# Patient Record
Sex: Female | Born: 1970 | Race: White | Hispanic: No | Marital: Married | Smoking: Never smoker
Health system: Southern US, Community
[De-identification: ages and names within clinical notes are randomized; demographics above are authoritative.]

## PROBLEM LIST (undated history)

## (undated) DIAGNOSIS — Z9889 Other specified postprocedural states: Secondary | ICD-10-CM

## (undated) DIAGNOSIS — E039 Hypothyroidism, unspecified: Secondary | ICD-10-CM

## (undated) DIAGNOSIS — R112 Nausea with vomiting, unspecified: Secondary | ICD-10-CM

## (undated) DIAGNOSIS — Z87442 Personal history of urinary calculi: Secondary | ICD-10-CM

## (undated) DIAGNOSIS — Z8719 Personal history of other diseases of the digestive system: Secondary | ICD-10-CM

## (undated) DIAGNOSIS — K219 Gastro-esophageal reflux disease without esophagitis: Secondary | ICD-10-CM

## (undated) DIAGNOSIS — F419 Anxiety disorder, unspecified: Secondary | ICD-10-CM

## (undated) HISTORY — PX: TUBAL LIGATION: SHX77

## (undated) HISTORY — PX: NASAL SINUS SURGERY: SHX719

---

## 1999-02-14 ENCOUNTER — Other Ambulatory Visit: Admission: RE | Admit: 1999-02-14 | Discharge: 1999-02-14 | Payer: Self-pay | Admitting: Otolaryngology

## 2010-01-16 ENCOUNTER — Ambulatory Visit (HOSPITAL_COMMUNITY): Admission: RE | Admit: 2010-01-16 | Discharge: 2010-01-16 | Payer: Self-pay | Admitting: Family Medicine

## 2010-02-06 ENCOUNTER — Ambulatory Visit (HOSPITAL_COMMUNITY): Admission: RE | Admit: 2010-02-06 | Discharge: 2010-02-06 | Payer: Self-pay | Admitting: Family Medicine

## 2010-03-27 ENCOUNTER — Ambulatory Visit (HOSPITAL_COMMUNITY): Admission: RE | Admit: 2010-03-27 | Discharge: 2010-03-27 | Payer: Self-pay | Admitting: Family Medicine

## 2010-05-16 ENCOUNTER — Ambulatory Visit (HOSPITAL_COMMUNITY): Admission: RE | Admit: 2010-05-16 | Discharge: 2010-05-16 | Payer: Self-pay | Admitting: Family Medicine

## 2010-06-27 ENCOUNTER — Ambulatory Visit (HOSPITAL_COMMUNITY): Admission: RE | Admit: 2010-06-27 | Discharge: 2010-06-27 | Payer: Self-pay | Admitting: Family Medicine

## 2010-07-25 ENCOUNTER — Ambulatory Visit (HOSPITAL_COMMUNITY): Admission: RE | Admit: 2010-07-25 | Discharge: 2010-07-25 | Payer: Self-pay | Admitting: Family Medicine

## 2010-08-13 ENCOUNTER — Ambulatory Visit: Payer: Self-pay | Admitting: Obstetrics and Gynecology

## 2010-08-13 ENCOUNTER — Inpatient Hospital Stay (HOSPITAL_COMMUNITY): Admission: AD | Admit: 2010-08-13 | Discharge: 2010-08-13 | Payer: Self-pay | Admitting: Obstetrics and Gynecology

## 2010-08-18 ENCOUNTER — Inpatient Hospital Stay (HOSPITAL_COMMUNITY): Admission: RE | Admit: 2010-08-18 | Discharge: 2010-08-21 | Payer: Self-pay | Admitting: Obstetrics and Gynecology

## 2011-02-12 LAB — URINALYSIS, ROUTINE W REFLEX MICROSCOPIC
Bilirubin Urine: NEGATIVE
Glucose, UA: NEGATIVE mg/dL
Hgb urine dipstick: NEGATIVE
Nitrite: NEGATIVE
Specific Gravity, Urine: 1.015 (ref 1.005–1.030)

## 2011-02-12 LAB — URINE MICROSCOPIC-ADD ON

## 2011-02-12 LAB — COMPREHENSIVE METABOLIC PANEL
ALT: 8 U/L (ref 0–35)
AST: 17 U/L (ref 0–37)
Albumin: 2.6 g/dL — ABNORMAL LOW (ref 3.5–5.2)
CO2: 23 mEq/L (ref 19–32)
Calcium: 8.5 mg/dL (ref 8.4–10.5)
GFR calc non Af Amer: >60 mL/min (ref >60)
Glucose, Bld: 77 mg/dL (ref 70–99)

## 2011-02-12 LAB — CBC
Hemoglobin: 9.5 g/dL — ABNORMAL LOW (ref 12.0–15.0)
Hemoglobin: 10.8 g/dL — ABNORMAL LOW (ref 12.0–15.0)
Hemoglobin: 12.1 g/dL (ref 12.0–15.0)
MCH: 29.7 pg (ref 26.0–34.0)
MCHC: 33.7 g/dL (ref 30.0–36.0)
MCHC: 33.8 g/dL (ref 30.0–36.0)
MCV: 87.5 fL (ref 78.0–100.0)
MCV: 86.7 fL (ref 78.0–100.0)
Platelets: 204 10*3/uL (ref 150–400)
RBC: 3.22 MIL/uL — ABNORMAL LOW (ref 3.87–5.11)
RBC: 3.69 MIL/uL — ABNORMAL LOW (ref 3.87–5.11)
RBC: 4.07 MIL/uL (ref 3.87–5.11)
RDW: 14.5 % (ref 11.5–15.5)
WBC: 6.4 10*3/uL (ref 4.0–10.5)
WBC: 6.5 10*3/uL (ref 4.0–10.5)

## 2011-02-12 LAB — SURGICAL PCR SCREEN
MRSA, PCR: NEGATIVE
Staphylococcus aureus: NEGATIVE

## 2011-02-12 LAB — URIC ACID: Uric Acid, Serum: 4.4 mg/dL (ref 2.4–7.0)

## 2011-05-26 NOTE — Consult Note (Signed)
[name], [name]             ACCOUNT NO.:  [id number]  MEDICAL RECORD NO.:  [id number]          PATIENT TYPE:  OUT  LOCATION:  MFM                           FACILITY:  [facility]  PHYSICIAN:  Jodel Mayhall L. Rachel Bo, MD   DATE OF BIRTH:  August 30, 1971  DATE OF CONSULTATION:  01/16/2010 DATE OF DISCHARGE:  01/16/2010                                CONSULTATION  Dear Dr. Tiburcio Pea,  Thank you for referring your patient, Sabrina [name] for maternal fetal medicine consultation.  As you are aware, Sabrina [name] is a 40 year old gravida 3, para 0-0-2-0 at 8 weeks and 4 days gestation by her last menstrual period.  As you are also aware, Sabrina [name] has a history of two prior spontaneous abortions and a history of a positive ANA.  Sabrina [name] was feeling well today and had no specific complaints other than nausea and fatigue that were consistent with early pregnancy.  She denied any symptoms of vaginal bleeding or pelvic pain since her last menstrual period but has not yet established care with an obstetrician.  Sabrina [name]'s obstetrical history as is as follows: In 2003 Sabrina [name] spontaneously conceived and experienced vaginal bleeding early in pregnancy.  She underwent 2 ultrasounds, the second of which identified a fetal pole with a crown-rump length correlating with 7 weeks 5 days gestation.  No fetal cardiac activity was noted at that time and she was diagnosed with a missed abortion.  This pregnancy was managed expectantly and Sabrina [name] subsequently conceived again in 2004.  She had an uneventful early pregnancy and an early ultrasound at 5 weeks 5 days measured fetal cardiac activity.  A subsequent ultrasound several weeks later revealed a crown-rump length of 6 weeks and 1 day with no cardiac activity.  This pregnancy was also managed with administration of Cytotec vaginally and the patient reports that all the products of conception passed without difficulty.  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  Sabrina [name] denies ever being diagnosed with overt lupus and reports that she sees a rheumatologist yearly for follow-up and evaluation of this.  She states that her ANAs have been persistently positive and that this has been identified as a potential contributing factor to her pregnancy losses.  Sabrina [name] also reports that, at the time of diagnosis of her positive ANA, the need for low-molecular-weight heparin and future pregnancy was discussed and recommended and, because of this, she elected not to attempt conception again.  In the interim Sabrina [name] has adopted a son, who is currently 3 years old and healthy.  Sabrina [name] additional medical history includes migraine headaches which seem to be controlled at the present time.  Gastroesophageal reflux, for which she is currently taking Zantac with good control of her symptoms, and anxiety for which she is on Lexapro 10 mg daily.  She reports her anxiety symptoms to be stable on this regimen.  Her only other medications include prenatal vitamins.  She is allergic to ERYTHROMYCIN, which causes a rash.  Sabrina [name] has no significant surgical history.  She has no  history of STDs, has had intermittently abnormal Pap smears in the past with good follow-up.  She denies any cervical or uterine procedures in the past.  Sabrina [name] does not smoke, use alcohol regularly or use any street drugs.  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  REVIEW OF SYSTEMS:  Was performed today, was negative, including excessive anxiety, suicidal or homicidal ideation, excess of vomiting or abdominal or pelvic pain.  Ms. Hollifield reports a recent URI with resolving symptoms at present.  EXAM:  Sabrina [name]'s blood pressure is 104/68, her pulse was 89 beats per minute.  Her weight was 143 pounds. She appeared in no acute distress and her  abdomen is appropriate with no detectable masses or tenderness.  Sabrina [name] underwent obstetrical ultrasound today which confirmed a single, live intrauterine pregnancy with a crown-rump length consistent with dating by her last menstrual period.  A single small anterior uterine fibroid was identified, measuring 1.8 cm in greatest mention and her adnexa appeared within normal limits.  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  Her prior workup included anticardiolipin antibodies, lupus anticoagulant, factor V Leyden and ANA.  As mentioned above, she has been found to have positive ANA with a high titer that has been followed yearly with the rheumatologist.  It was discussed that ANA can be a contributing factor to recurrent miscarriages, but often does not make a strong contribution in the absence of other findings.  It was also discussed that lupus anticoagulant and anticardiolipin antibodies can vary with time and it was recommended that she undergo a repeat evaluation of these today.  These were drawn, along with an ANA, in our office today.  Sabrina [name] was very concerned about the need for anticoagulation during her pregnancy and we discussed that, in a gestation where cardiac activity was previously confirmed and then a fetal demise was found, at times this may be indicative of an underlying autoimmune process.  It was also discussed that at times, but not always, Lovenox therapy can result in improved outcome with subsequent pregnancies.  The risks and benefits of Lovenox were described and discussed at length today, including maternal local and systemic reactions, bleeding, antepartum, intrapartum and postpartum hemorrhage and heparin-associated thrombocytopenia.  The unclear indication for use of this during pregnancy was discussed but after an extensive review of this therapy, Sabrina [name] elected to proceed with Lovenox therapy.   A prescription was given for 40 mg subcu daily with education provided by in our office today on self-administration.  Careful precautions were given.  It was also discussed that fetal aneuploidy can contribute to pregnancy loss and the option of parental peripheral blood chromosomes was discussed.  Sabrina [name] and her husband elected to proceed with this testing and blood samples were drawn from each of them today for evaluation of parental karyotype.  The process by which parental translocations and trisomies can result in embryonic loss was also discussed.  Uterine abnormalities as a contributing factor to pregnancy loss was also described today.  No clear abnormalities were identified on Ms. Diggs's ultrasound with the exception of a small anterior uterine fibroid.  It was discussed that this fibroid likely has not made a contribution to her prior pregnancy losses and the limitations of ultrasound in identifying uterine abnormalities was also reviewed.  It was also discussed that the common modalities for diagnosis of uterine anomalies, including HSG, were not able to be done while  the patient is currently pregnant.  Advanced maternal age was also discussed with Ms. Gunner today. Screening and diagnostic options for fetal aneuploidy were reviewed and Ms. Saldarriaga expressed desire to possibly proceed with first trimester screening for fetal aneuploidy.  She remains uncertain about this, but a follow-up ultrasound was scheduled in 3-4 weeks, which would be in the time range that a nuchal translucency could be performed and we will discuss this further at her follow-up visit.  At the present time.  She believes that she does not want amniocentesis for determination of fetal karyotype but also may wish to discuss this further at a followup visit.  The use of SSRIs during pregnancy was also discussed with Ms. Boulanger today, including Lexapro.  Ms. Weyrauch remains on a low dose of  Lexapro with good control of her symptoms.  The risk/benefit ratio of any medication in pregnancy was discussed as well as the recommendation to use the lowest possible dose of the fewest number of medications.  The potential association of SSRIs with fetal anomalies, as well as transition issues in the neonatal period was described.  The limitations of the current literature available addressing this issue was also reviewed with Ms. Arriola.  At the present time she desires to remain on Lexapro.  Ms. Friesen does report some ongoing nausea associated with pregnancy.  A prescription was given for Phenergan 25 mg to use every 8 hours as needed for nausea, 20 tablets with no refills.  She was also given a prescription for prenatal vitamins and encouraged to seek to establish care with an obstetrician as soon as possible.  We look forward to seeing Ms. Staff back in the office and will follow up her labs.  We will also reevaluate the need for Lovenox at her follow- up visit.  We will also discuss screening for fetal aneuploidy again with Ms. Bywater at a follow-up visit.  Contact information and precautions were given.  Thank you for allowing [place] to participate in the care of Ms. Frankey Poot. Please free to contact us at any time regarding this or any patient you may have.          ______________________________ Macarthur Critchley. Rachel Bo, MD    HLM/MEDQ  D:  01/19/2010  T:  01/19/2010  Job:  [id number]  Electronically Signed by Rica Koyanagi MD on 05/26/2011 10:31:23 AM

## 2013-11-30 HISTORY — PX: HERNIA REPAIR: SHX51

## 2014-07-26 ENCOUNTER — Ambulatory Visit (INDEPENDENT_AMBULATORY_CARE_PROVIDER_SITE_OTHER): Payer: No Typology Code available for payment source | Admitting: General Surgery

## 2015-04-12 ENCOUNTER — Other Ambulatory Visit: Payer: Self-pay | Admitting: Obstetrics and Gynecology

## 2015-04-12 DIAGNOSIS — R928 Other abnormal and inconclusive findings on diagnostic imaging of breast: Secondary | ICD-10-CM

## 2015-04-18 ENCOUNTER — Ambulatory Visit
Admission: RE | Admit: 2015-04-18 | Discharge: 2015-04-18 | Disposition: A | Discharge status: Home / Self Care | Payer: PRIVATE HEALTH INSURANCE | Source: Ambulatory Visit | Attending: Obstetrics and Gynecology | Admitting: Obstetrics and Gynecology

## 2015-04-18 DIAGNOSIS — R928 Other abnormal and inconclusive findings on diagnostic imaging of breast: Secondary | ICD-10-CM

## 2015-09-24 ENCOUNTER — Other Ambulatory Visit: Payer: Self-pay | Admitting: Obstetrics and Gynecology

## 2015-09-24 DIAGNOSIS — N6320 Unspecified lump in the left breast, unspecified quadrant: Secondary | ICD-10-CM

## 2015-10-31 ENCOUNTER — Other Ambulatory Visit: Payer: Self-pay | Admitting: Obstetrics and Gynecology

## 2015-10-31 ENCOUNTER — Ambulatory Visit
Admission: RE | Admit: 2015-10-31 | Discharge: 2015-10-31 | Disposition: A | Discharge status: Home / Self Care | Payer: PRIVATE HEALTH INSURANCE | Source: Ambulatory Visit | Attending: Obstetrics and Gynecology | Admitting: Obstetrics and Gynecology

## 2015-10-31 DIAGNOSIS — N6320 Unspecified lump in the left breast, unspecified quadrant: Secondary | ICD-10-CM

## 2015-12-01 HISTORY — PX: LITHOTRIPSY: SUR834

## 2017-11-30 HISTORY — PX: LASIK: SHX215

## 2020-08-29 ENCOUNTER — Ambulatory Visit (INDEPENDENT_AMBULATORY_CARE_PROVIDER_SITE_OTHER): Payer: BC Managed Care – PPO | Admitting: General Surgery

## 2020-08-29 ENCOUNTER — Encounter: Payer: Self-pay | Admitting: General Surgery

## 2020-08-29 ENCOUNTER — Other Ambulatory Visit: Payer: Self-pay

## 2020-08-29 ENCOUNTER — Encounter (INDEPENDENT_AMBULATORY_CARE_PROVIDER_SITE_OTHER): Payer: Self-pay

## 2020-08-29 VITALS — BP 103/69 | HR 74 | Temp 98.5°F | Resp 14 | Ht 64.5 in | Wt 167.0 lb

## 2020-08-29 DIAGNOSIS — K811 Chronic cholecystitis: Secondary | ICD-10-CM

## 2020-08-29 NOTE — Patient Instructions (Addendum)
Dr. Maryella Shivers, Endocrinology, [place]     Laparoscopic Cholecystectomy Laparoscopic cholecystectomy is surgery to remove the gallbladder. The gallbladder is a pear-shaped organ that lies beneath the liver on the right side of the body. The gallbladder stores bile, which is a fluid that helps the body to digest fats. Cholecystectomy is often done for inflammation of the gallbladder (cholecystitis). This condition is usually caused by a buildup of gallstones (cholelithiasis) in the gallbladder. Gallstones can block the flow of bile, which can result in inflammation and pain. In severe cases, emergency surgery may be required. This procedure is done though small incisions in your abdomen (laparoscopic surgery). A thin scope with a camera (laparoscope) is inserted through one incision. Thin surgical instruments are inserted through the other incisions. In some cases, a laparoscopic procedure may be turned into a type of surgery that is done through a larger incision (open surgery). Tell a health care provider about:  Any allergies you have.  All medicines you are taking, including vitamins, herbs, eye drops, creams, and over-the-counter medicines.  Any problems you or family members have had with anesthetic medicines.  Any blood disorders you have.  Any surgeries you have had.  Any medical conditions you have.  Whether you are pregnant or may be pregnant. What are the risks? Generally, this is a safe procedure. However, problems may occur, including:  Infection.  Bleeding.  Allergic reactions to medicines.  Damage to other structures or organs.  A stone remaining in the common bile duct. The common bile duct carries bile from the gallbladder into the small intestine.  A bile leak from the cyst duct that is clipped when your gallbladder is removed. What happens before the procedure?   Medicines  Ask your health care provider about: ? Changing or stopping your regular  medicines. This is especially important if you are taking diabetes medicines or blood thinners. ? Taking medicines such as aspirin and ibuprofen. These medicines can thin your blood. Do not take these medicines before your procedure if your health care provider instructs you not to.  You may be given antibiotic medicine to help prevent infection. General instructions  Let your health care provider know if you develop a cold or an infection before surgery.  Plan to have someone take you home from the hospital or clinic.  Ask your health care provider how your surgical site will be marked or identified. What happens during the procedure?   To reduce your risk of infection: ? Your health care team will wash or sanitize their hands. ? Your skin will be washed with soap. ? Hair may be removed from the surgical area.  An IV tube may be inserted into one of your veins.  You will be given one or more of the following: ? A medicine to help you relax (sedative). ? A medicine to make you fall asleep (general anesthetic).  A breathing tube will be placed in your mouth.  Your surgeon will make several small cuts (incisions) in your abdomen.  The laparoscope will be inserted through one of the small incisions. The camera on the laparoscope will send images to a TV screen (monitor) in the operating room. This lets your surgeon see inside your abdomen.  Air-like gas will be pumped into your abdomen. This will expand your abdomen to give the surgeon more room to perform the surgery.  Other tools that are needed for the procedure will be inserted through the other incisions. The gallbladder will be removed through  one of the incisions.  Your common bile duct may be examined. If stones are found in the common bile duct, they may be removed.  After your gallbladder has been removed, the incisions will be closed with stitches (sutures), staples, or skin glue.  Your incisions may be covered with a  bandage (dressing). The procedure may vary among health care providers and hospitals. What happens after the procedure?  Your blood pressure, heart rate, breathing rate, and blood oxygen level will be monitored until the medicines you were given have worn off.  You will be given medicines as needed to control your pain.  Do not drive for 24 hours if you were given a sedative. This information is not intended to replace advice given to you by your health care provider. Make sure you discuss any questions you have with your health care provider. Document Revised: 10/29/2017 Document Reviewed: 05/04/2016 Elsevier Patient Education  [employer].

## 2020-08-29 NOTE — Progress Notes (Signed)
Sabrina [name]; [id number]; 07/07/1971   HPI Patient is a 49 year old white female who was referred to my care by Dr. Norval Gable for evaluation and treatment of a dysfunctional gallbladder.  Patient has had epigastric pain as well as right upper quadrant abdominal pain which radiates to her back for several months now.  Seems to be occurring intermittently throughout the day, but is worse in the afternoons.  She also has describes some bloating and nausea.  It is not necessarily associated with fatty foods.  No fever, chills, jaundice have been noted.  Ultrasound the gallbladder was negative.  She currently has around 2 out of 10 epigastric pain. History reviewed. No pertinent past medical history.  History reviewed. No pertinent surgical history.  History reviewed. No pertinent family history.  Current Outpatient Medications on File Prior to Visit  Medication Sig Dispense Refill  . Ascorbic Acid (VITAMIN C) 1000 MG tablet Take 1,000 mg by mouth daily.    . busPIRone (BUSPAR) 7.5 MG tablet Take 7.5 mg by mouth 3 (three) times daily.    . cholecalciferol (VITAMIN D3) 25 MCG (1000 UNIT) tablet Take 1,000 Units by mouth daily.    Marland Kitchen eletriptan (RELPAX) 20 MG tablet Take 20 mg by mouth as needed for migraine or headache. May repeat in 2 hours if headache persists or recurs.    Marland Kitchen FLUoxetine (PROZAC) 10 MG capsule Take 10 mg by mouth daily.    Marland Kitchen levocetirizine (XYZAL) 5 MG tablet Take 5 mg by mouth every evening.    Marland Kitchen levothyroxine (SYNTHROID) 100 MCG tablet Take by mouth.    . pantoprazole (PROTONIX) 40 MG tablet TAKE 1 TABLET  40 MG  BY MOUTH ONE TIME DAILY    . vitamin E (VITAMIN E) 180 MG (400 UNITS) capsule Take 400 Units by mouth daily.     No current facility-administered medications on file prior to visit.    Allergies  Allergen Reactions  . Erythromycin Palpitations  . Nitrofurantoin Palpitations    Social History   Substance and Sexual Activity  Alcohol Use Never    Social  History   Tobacco Use  Smoking Status Never Smoker  Smokeless Tobacco Never Used    Review of Systems  Constitutional: Negative.   HENT: Positive for sinus pain.   Eyes: Negative.   Respiratory: Negative.   Cardiovascular: Negative.   Gastrointestinal: Positive for abdominal pain and heartburn.  Genitourinary: Negative.   Musculoskeletal: Negative.   Skin: Negative.   Neurological: Positive for headaches.  Endo/Heme/Allergies: Negative.   Psychiatric/Behavioral: Negative.     Objective   Vitals:   08/29/20 0903  BP: 103/69  Pulse: 74  Resp: 14  Temp: 98.5 F (36.9 C)  SpO2: 99%    Physical Exam Vitals reviewed.  Constitutional:      Appearance: Normal appearance. She is normal weight. She is not ill-appearing.  HENT:     Head: Normocephalic and atraumatic.  Eyes:     General: No scleral icterus. Cardiovascular:     Rate and Rhythm: Normal rate and regular rhythm.     Heart sounds: Normal heart sounds. No murmur heard.  No friction rub. No gallop.   Pulmonary:     Effort: Pulmonary effort is normal. No respiratory distress.     Breath sounds: No stridor. No wheezing, rhonchi or rales.  Abdominal:     General: Bowel sounds are normal. There is no distension.     Palpations: Abdomen is soft. There is no mass.  Tenderness: There is no abdominal tenderness. There is no guarding or rebound.     Hernia: No hernia is present.  Skin:    General: Skin is warm and dry.  Neurological:     Mental Status: She is alert and oriented to person, place, and time.    Primary care notes reviewed.  Liver tests noted to be within normal limits on 07/04/2020 Assessment  Chronic cholecystitis Plan   Patient will call to schedule laparoscopic cholecystectomy.  She does realize that she has a history of a hiatal hernia and that not all of her symptoms may resolve.  She is interested in a referral to a gastroenterologist after the surgery.  She is also interested in seeing an  endocrinologist due to her history of thyroid disease.  The risks and benefits of the procedure including bleeding, infection, hepatobiliary injury, and the possibility of an open procedure were fully explained to the patient, who gave informed consent.

## 2020-09-03 NOTE — H&P (Signed)
Sabrina [name]; [id number]; 07/07/1971   HPI Patient is a 49 year old white female who was referred to my care by Dr. Norval Gable for evaluation and treatment of a dysfunctional gallbladder.  Patient has had epigastric pain as well as right upper quadrant abdominal pain which radiates to her back for several months now.  Seems to be occurring intermittently throughout the day, but is worse in the afternoons.  She also has describes some bloating and nausea.  It is not necessarily associated with fatty foods.  No fever, chills, jaundice have been noted.  Ultrasound the gallbladder was negative.  She currently has around 2 out of 10 epigastric pain. History reviewed. No pertinent past medical history.  History reviewed. No pertinent surgical history.  History reviewed. No pertinent family history.  Current Outpatient Medications on File Prior to Visit  Medication Sig Dispense Refill  . Ascorbic Acid (VITAMIN C) 1000 MG tablet Take 1,000 mg by mouth daily.    . busPIRone (BUSPAR) 7.5 MG tablet Take 7.5 mg by mouth 3 (three) times daily.    . cholecalciferol (VITAMIN D3) 25 MCG (1000 UNIT) tablet Take 1,000 Units by mouth daily.    Marland Kitchen eletriptan (RELPAX) 20 MG tablet Take 20 mg by mouth as needed for migraine or headache. May repeat in 2 hours if headache persists or recurs.    Marland Kitchen FLUoxetine (PROZAC) 10 MG capsule Take 10 mg by mouth daily.    Marland Kitchen levocetirizine (XYZAL) 5 MG tablet Take 5 mg by mouth every evening.    Marland Kitchen levothyroxine (SYNTHROID) 100 MCG tablet Take by mouth.    . pantoprazole (PROTONIX) 40 MG tablet TAKE 1 TABLET  40 MG  BY MOUTH ONE TIME DAILY    . vitamin E (VITAMIN E) 180 MG (400 UNITS) capsule Take 400 Units by mouth daily.     No current facility-administered medications on file prior to visit.    Allergies  Allergen Reactions  . Erythromycin Palpitations  . Nitrofurantoin Palpitations    Social History   Substance and Sexual Activity  Alcohol Use Never    Social  History   Tobacco Use  Smoking Status Never Smoker  Smokeless Tobacco Never Used    Review of Systems  Constitutional: Negative.   HENT: Positive for sinus pain.   Eyes: Negative.   Respiratory: Negative.   Cardiovascular: Negative.   Gastrointestinal: Positive for abdominal pain and heartburn.  Genitourinary: Negative.   Musculoskeletal: Negative.   Skin: Negative.   Neurological: Positive for headaches.  Endo/Heme/Allergies: Negative.   Psychiatric/Behavioral: Negative.     Objective   Vitals:   08/29/20 0903  BP: 103/69  Pulse: 74  Resp: 14  Temp: 98.5 F (36.9 C)  SpO2: 99%    Physical Exam Vitals reviewed.  Constitutional:      Appearance: Normal appearance. She is normal weight. She is not ill-appearing.  HENT:     Head: Normocephalic and atraumatic.  Eyes:     General: No scleral icterus. Cardiovascular:     Rate and Rhythm: Normal rate and regular rhythm.     Heart sounds: Normal heart sounds. No murmur heard.  No friction rub. No gallop.   Pulmonary:     Effort: Pulmonary effort is normal. No respiratory distress.     Breath sounds: No stridor. No wheezing, rhonchi or rales.  Abdominal:     General: Bowel sounds are normal. There is no distension.     Palpations: Abdomen is soft. There is no mass.  Tenderness: There is no abdominal tenderness. There is no guarding or rebound.     Hernia: No hernia is present.  Skin:    General: Skin is warm and dry.  Neurological:     Mental Status: She is alert and oriented to person, place, and time.    Primary care notes reviewed.  Liver tests noted to be within normal limits on 07/04/2020 Assessment  Chronic cholecystitis Plan   Patient will call to schedule laparoscopic cholecystectomy.  She does realize that she has a history of a hiatal hernia and that not all of her symptoms may resolve.  She is interested in a referral to a gastroenterologist after the surgery.  She is also interested in seeing an  endocrinologist due to her history of thyroid disease.  The risks and benefits of the procedure including bleeding, infection, hepatobiliary injury, and the possibility of an open procedure were fully explained to the patient, who gave informed consent. 

## 2020-09-10 NOTE — Patient Instructions (Signed)
Sabrina [name]  09/10/2020     @PREFPERIOPPHARMACY @   Your procedure is scheduled on 09/13/2020.  Report to 09/15/2020 at  [phone]  A.M.  Call this number if you have problems the morning of surgery:  [phone]   Remember:  Do not eat or drink after midnight.                        Take these medicines the morning of surgery with A SIP OF WATER buspar, relpax(if needed), prozac, levothyroxine.    Do not wear jewelry, make-up or nail polish.  Do not wear lotions, powders, or perfumes. Please wear deodorant and brush your teeth.  Do not shave 48 hours prior to surgery.  Men may shave face and neck.  Do not bring valuables to the hospital.  [facility] is not responsible for any belongings or valuables.  Contacts, dentures or bridgework may not be worn into surgery.  Leave your suitcase in the car.  After surgery it may be brought to your room.  For patients admitted to the hospital, discharge time will be determined by your treatment team.  Patients discharged the day of surgery will not be allowed to drive home.   Name and phone number of your driver:   family Special instructions:   DO NOT smoke the morning of your procedure.  Please read over the following fact sheets that you were given. Anesthesia Post-op Instructions and Care and Recovery After Surgery       Laparoscopic Cholecystectomy, Care After This sheet gives you information about how to care for yourself after your procedure. Your health care provider may also give you more specific instructions. If you have problems or questions, contact your health care provider. What can I expect after the procedure? After the procedure, it is common to have:  Pain at your incision sites. You will be given medicines to control this pain.  Mild nausea or vomiting.  Bloating and possible shoulder pain from the air-like gas that was used during the procedure. Follow these instructions at home: Incision  care   Follow instructions from your health care provider about how to take care of your incisions. Make sure you: ? Wash your hands with soap and water before you change your bandage (dressing). If soap and water are not available, use hand sanitizer. ? Change your dressing as told by your health care provider. ? Leave stitches (sutures), skin glue, or adhesive strips in place. These skin closures may need to be in place for 2 weeks or longer. If adhesive strip edges start to loosen and curl up, you may trim the loose edges. Do not remove adhesive strips completely unless your health care provider tells you to do that.  Do not take baths, swim, or use a hot tub until your health care provider approves. Ask your health care provider if you can take showers. You may only be allowed to take sponge baths for bathing.  Check your incision area every day for signs of infection. Check for: ? More redness, swelling, or pain. ? More fluid or blood. ? Warmth. ? Pus or a bad smell. Activity  Do not drive or use heavy machinery while taking prescription pain medicine.  Do not lift anything that is heavier than 10 lb (4.5 kg) until your health care provider approves.  Do not play contact sports until your health care provider approves.  Do not  drive for 24 hours if you were given a medicine to help you relax (sedative).  Rest as needed. Do not return to work or school until your health care provider approves. General instructions  Take over-the-counter and prescription medicines only as told by your health care provider.  To prevent or treat constipation while you are taking prescription pain medicine, your health care provider may recommend that you: ? Drink enough fluid to keep your urine clear or pale yellow. ? Take over-the-counter or prescription medicines. ? Eat foods that are high in fiber, such as fresh fruits and vegetables, whole grains, and beans. ? Limit foods that are high in fat  and processed sugars, such as fried and sweet foods. Contact a health care provider if:  You develop a rash.  You have more redness, swelling, or pain around your incisions.  You have more fluid or blood coming from your incisions.  Your incisions feel warm to the touch.  You have pus or a bad smell coming from your incisions.  You have a fever.  One or more of your incisions breaks open. Get help right away if:  You have trouble breathing.  You have chest pain.  You have increasing pain in your shoulders.  You faint or feel dizzy when you stand.  You have severe pain in your abdomen.  You have nausea or vomiting that lasts for more than one day.  You have leg pain. This information is not intended to replace advice given to you by your health care provider. Make sure you discuss any questions you have with your health care provider. Document Revised: 10/29/2017 Document Reviewed: 05/04/2016 Elsevier Patient Education  2020 [employer]  General Anesthesia, Adult, Care After This sheet gives you information about how to care for yourself after your procedure. Your health care provider may also give you more specific instructions. If you have problems or questions, contact your health care provider. What can I expect after the procedure? After the procedure, the following side effects are common:  Pain or discomfort at the IV site.  Nausea.  Vomiting.  Sore throat.  Trouble concentrating.  Feeling cold or chills.  Weak or tired.  Sleepiness and fatigue.  Soreness and body aches. These side effects can affect parts of the body that were not involved in surgery. Follow these instructions at home:  For at least 24 hours after the procedure:  Have a responsible adult stay with you. It is important to have someone help care for you until you are awake and alert.  Rest as needed.  Do not: ? Participate in activities in which you could fall or become  injured. ? Drive. ? Use heavy machinery. ? Drink alcohol. ? Take sleeping pills or medicines that cause drowsiness. ? Make important decisions or sign legal documents. ? Take care of children on your own. Eating and drinking  Follow any instructions from your health care provider about eating or drinking restrictions.  When you feel hungry, start by eating small amounts of foods that are soft and easy to digest (bland), such as toast. Gradually return to your regular diet.  Drink enough fluid to keep your urine pale yellow.  If you vomit, rehydrate by drinking water, juice, or clear broth. General instructions  If you have sleep apnea, surgery and certain medicines can increase your risk for breathing problems. Follow instructions from your health care provider about wearing your sleep device: ? Anytime you are sleeping, including during daytime naps. ?  While taking prescription pain medicines, sleeping medicines, or medicines that make you drowsy.  Return to your normal activities as told by your health care provider. Ask your health care provider what activities are safe for you.  Take over-the-counter and prescription medicines only as told by your health care provider.  If you smoke, do not smoke without supervision.  Keep all follow-up visits as told by your health care provider. This is important. Contact a health care provider if:  You have nausea or vomiting that does not get better with medicine.  You cannot eat or drink without vomiting.  You have pain that does not get better with medicine.  You are unable to pass urine.  You develop a skin rash.  You have a fever.  You have redness around your IV site that gets worse. Get help right away if:  You have difficulty breathing.  You have chest pain.  You have blood in your urine or stool, or you vomit blood. Summary  After the procedure, it is common to have a sore throat or nausea. It is also common to  feel tired.  Have a responsible adult stay with you for the first 24 hours after general anesthesia. It is important to have someone help care for you until you are awake and alert.  When you feel hungry, start by eating small amounts of foods that are soft and easy to digest (bland), such as toast. Gradually return to your regular diet.  Drink enough fluid to keep your urine pale yellow.  Return to your normal activities as told by your health care provider. Ask your health care provider what activities are safe for you. This information is not intended to replace advice given to you by your health care provider. Make sure you discuss any questions you have with your health care provider. Document Revised: 11/19/2017 Document Reviewed: 07/02/2017 Elsevier Patient Education  2020 [employer]. How to Use Chlorhexidine for Bathing Chlorhexidine gluconate (CHG) is a germ-killing (antiseptic) solution that is used to clean the skin. It can get rid of the bacteria that normally live on the skin and can keep them away for about 24 hours. To clean your skin with CHG, you may be given:  A CHG solution to use in the shower or as part of a sponge bath.  A prepackaged cloth that contains CHG. Cleaning your skin with CHG may help lower the risk for infection:  While you are staying in the intensive care unit of the hospital.  If you have a vascular access, such as a central line, to provide short-term or long-term access to your veins.  If you have a catheter to drain urine from your bladder.  If you are on a ventilator. A ventilator is a machine that helps you breathe by moving air in and out of your lungs.  After surgery. What are the risks? Risks of using CHG include:  A skin reaction.  Hearing loss, if CHG gets in your ears.  Eye injury, if CHG gets in your eyes and is not rinsed out.  The CHG product catching fire. Make sure that you avoid smoking and flames after applying CHG to  your skin. Do not use CHG:  If you have a chlorhexidine allergy or have previously reacted to chlorhexidine.  On babies younger than 9 months of age. How to use CHG solution  Use CHG only as told by your health care provider, and follow the instructions on the label.  Use  the full amount of CHG as directed. Usually, this is one bottle. During a shower Follow these steps when using CHG solution during a shower (unless your health care provider gives you different instructions): 1. Start the shower. 2. Use your normal soap and shampoo to wash your face and hair. 3. Turn off the shower or move out of the shower stream. 4. Pour the CHG onto a clean washcloth. Do not use any type of brush or rough-edged sponge. 5. Starting at your neck, lather your body down to your toes. Make sure you follow these instructions: ? If you will be having surgery, pay special attention to the part of your body where you will be having surgery. Scrub this area for at least 1 minute. ? Do not use CHG on your head or face. If the solution gets into your ears or eyes, rinse them well with water. ? Avoid your genital area. ? Avoid any areas of skin that have broken skin, cuts, or scrapes. ? Scrub your back and under your arms. Make sure to wash skin folds. 6. Let the lather sit on your skin for 1-2 minutes or as long as told by your health care provider. 7. Thoroughly rinse your entire body in the shower. Make sure that all body creases and crevices are rinsed well. 8. Dry off with a clean towel. Do not put any substances on your body afterward--such as powder, lotion, or perfume--unless you are told to do so by your health care provider. Only use lotions that are recommended by the manufacturer. 9. Put on clean clothes or pajamas. 10. If it is the night before your surgery, sleep in clean sheets.  During a sponge bath Follow these steps when using CHG solution during a sponge bath (unless your health care provider  gives you different instructions): 1. Use your normal soap and shampoo to wash your face and hair. 2. Pour the CHG onto a clean washcloth. 3. Starting at your neck, lather your body down to your toes. Make sure you follow these instructions: ? If you will be having surgery, pay special attention to the part of your body where you will be having surgery. Scrub this area for at least 1 minute. ? Do not use CHG on your head or face. If the solution gets into your ears or eyes, rinse them well with water. ? Avoid your genital area. ? Avoid any areas of skin that have broken skin, cuts, or scrapes. ? Scrub your back and under your arms. Make sure to wash skin folds. 4. Let the lather sit on your skin for 1-2 minutes or as long as told by your health care provider. 5. Using a different clean, wet washcloth, thoroughly rinse your entire body. Make sure that all body creases and crevices are rinsed well. 6. Dry off with a clean towel. Do not put any substances on your body afterward--such as powder, lotion, or perfume--unless you are told to do so by your health care provider. Only use lotions that are recommended by the manufacturer. 7. Put on clean clothes or pajamas. 8. If it is the night before your surgery, sleep in clean sheets. How to use CHG prepackaged cloths  Only use CHG cloths as told by your health care provider, and follow the instructions on the label.  Use the CHG cloth on clean, dry skin.  Do not use the CHG cloth on your head or face unless your health care provider tells you to.  When washing  with the CHG cloth: ? Avoid your genital area. ? Avoid any areas of skin that have broken skin, cuts, or scrapes. Before surgery Follow these steps when using a CHG cloth to clean before surgery (unless your health care provider gives you different instructions): 1. Using the CHG cloth, vigorously scrub the part of your body where you will be having surgery. Scrub using a back-and-forth  motion for 3 minutes. The area on your body should be completely wet with CHG when you are done scrubbing. 2. Do not rinse. Discard the cloth and let the area air-dry. Do not put any substances on the area afterward, such as powder, lotion, or perfume. 3. Put on clean clothes or pajamas. 4. If it is the night before your surgery, sleep in clean sheets.  For general bathing Follow these steps when using CHG cloths for general bathing (unless your health care provider gives you different instructions). 1. Use a separate CHG cloth for each area of your body. Make sure you wash between any folds of skin and between your fingers and toes. Wash your body in the following order, switching to a new cloth after each step: ? The front of your neck, shoulders, and chest. ? Both of your arms, under your arms, and your hands. ? Your stomach and groin area, avoiding the genitals. ? Your right leg and foot. ? Your left leg and foot. ? The back of your neck, your back, and your buttocks. 2. Do not rinse. Discard the cloth and let the area air-dry. Do not put any substances on your body afterward--such as powder, lotion, or perfume--unless you are told to do so by your health care provider. Only use lotions that are recommended by the manufacturer. 3. Put on clean clothes or pajamas. Contact a health care provider if:  Your skin gets irritated after scrubbing.  You have questions about using your solution or cloth. Get help right away if:  Your eyes become very red or swollen.  Your eyes itch badly.  Your skin itches badly and is red or swollen.  Your hearing changes.  You have trouble seeing.  You have swelling or tingling in your mouth or throat.  You have trouble breathing.  You swallow any chlorhexidine. Summary  Chlorhexidine gluconate (CHG) is a germ-killing (antiseptic) solution that is used to clean the skin. Cleaning your skin with CHG may help to lower your risk for infection.  You  may be given CHG to use for bathing. It may be in a bottle or in a prepackaged cloth to use on your skin. Carefully follow your health care provider's instructions and the instructions on the product label.  Do not use CHG if you have a chlorhexidine allergy.  Contact your health care provider if your skin gets irritated after scrubbing. This information is not intended to replace advice given to you by your health care provider. Make sure you discuss any questions you have with your health care provider. Document Revised: 02/02/2019 Document Reviewed: 10/14/2017 Elsevier Patient Education  2020 [employer].

## 2020-09-12 ENCOUNTER — Encounter (HOSPITAL_COMMUNITY): Payer: Self-pay

## 2020-09-12 ENCOUNTER — Other Ambulatory Visit: Payer: Self-pay

## 2020-09-12 ENCOUNTER — Encounter (HOSPITAL_COMMUNITY)
Admission: RE | Admit: 2020-09-12 | Discharge: 2020-09-12 | Disposition: A | Discharge status: Home / Self Care | Payer: BC Managed Care – PPO | Source: Ambulatory Visit | Attending: General Surgery | Admitting: General Surgery

## 2020-09-12 ENCOUNTER — Other Ambulatory Visit (HOSPITAL_COMMUNITY)
Admission: RE | Admit: 2020-09-12 | Discharge: 2020-09-12 | Disposition: A | Discharge status: Home / Self Care | Payer: BC Managed Care – PPO | Source: Ambulatory Visit | Attending: General Surgery | Admitting: General Surgery

## 2020-09-12 DIAGNOSIS — Z20822 Contact with and (suspected) exposure to covid-19: Secondary | ICD-10-CM | POA: Insufficient documentation

## 2020-09-12 DIAGNOSIS — Z01812 Encounter for preprocedural laboratory examination: Secondary | ICD-10-CM | POA: Insufficient documentation

## 2020-09-12 HISTORY — DX: Personal history of urinary calculi: Z87.442

## 2020-09-12 HISTORY — DX: Personal history of other diseases of the digestive system: Z87.19

## 2020-09-12 HISTORY — DX: Other specified postprocedural states: Z98.890

## 2020-09-12 HISTORY — DX: Hypothyroidism, unspecified: E03.9

## 2020-09-12 HISTORY — DX: Gastro-esophageal reflux disease without esophagitis: K21.9

## 2020-09-12 HISTORY — DX: Other specified postprocedural states: R11.2

## 2020-09-12 HISTORY — DX: Anxiety disorder, unspecified: F41.9

## 2020-09-12 LAB — RESPIRATORY PANEL BY RT PCR (FLU A&B, COVID)
Influenza A by PCR: NEGATIVE
Influenza B by PCR: NEGATIVE
SARS Coronavirus 2 by RT PCR: NEGATIVE

## 2020-09-12 LAB — HCG, SERUM, QUALITATIVE: Preg, Serum: NEGATIVE

## 2020-09-13 ENCOUNTER — Encounter (HOSPITAL_COMMUNITY): Payer: Self-pay | Admitting: General Surgery

## 2020-09-13 ENCOUNTER — Ambulatory Visit (HOSPITAL_COMMUNITY): Payer: BC Managed Care – PPO | Admitting: Anesthesiology

## 2020-09-13 ENCOUNTER — Ambulatory Visit (HOSPITAL_COMMUNITY)
Admission: RE | Admit: 2020-09-13 | Discharge: 2020-09-13 | Disposition: A | Discharge status: Home / Self Care | Payer: BC Managed Care – PPO | Attending: General Surgery | Admitting: General Surgery

## 2020-09-13 ENCOUNTER — Other Ambulatory Visit: Payer: Self-pay

## 2020-09-13 ENCOUNTER — Encounter (HOSPITAL_COMMUNITY)
Admission: RE | Disposition: A | Discharge status: Home / Self Care | Payer: Self-pay | Source: Home / Self Care | Attending: General Surgery

## 2020-09-13 DIAGNOSIS — K828 Other specified diseases of gallbladder: Secondary | ICD-10-CM | POA: Diagnosis not present

## 2020-09-13 DIAGNOSIS — F419 Anxiety disorder, unspecified: Secondary | ICD-10-CM | POA: Diagnosis not present

## 2020-09-13 DIAGNOSIS — K219 Gastro-esophageal reflux disease without esophagitis: Secondary | ICD-10-CM | POA: Insufficient documentation

## 2020-09-13 DIAGNOSIS — K811 Chronic cholecystitis: Secondary | ICD-10-CM | POA: Insufficient documentation

## 2020-09-13 DIAGNOSIS — E039 Hypothyroidism, unspecified: Secondary | ICD-10-CM | POA: Diagnosis not present

## 2020-09-13 DIAGNOSIS — K449 Diaphragmatic hernia without obstruction or gangrene: Secondary | ICD-10-CM | POA: Diagnosis not present

## 2020-09-13 DIAGNOSIS — Z79899 Other long term (current) drug therapy: Secondary | ICD-10-CM | POA: Diagnosis not present

## 2020-09-13 DIAGNOSIS — Z79890 Hormone replacement therapy (postmenopausal): Secondary | ICD-10-CM | POA: Diagnosis not present

## 2020-09-13 HISTORY — PX: CHOLECYSTECTOMY: SHX55

## 2020-09-13 SURGERY — LAPAROSCOPIC CHOLECYSTECTOMY
Anesthesia: General

## 2020-09-13 MED ORDER — LACTATED RINGERS IV SOLN: Freq: Once | INTRAVENOUS | Status: AC

## 2020-09-13 MED ORDER — EPHEDRINE 5 MG/ML INJ
INTRAVENOUS | Status: AC
  Filled 2020-09-13: qty 10

## 2020-09-13 MED ORDER — SODIUM CHLORIDE 0.9 % IR SOLN
Status: DC | PRN
  Administered 2020-09-13: 1000 mL

## 2020-09-13 MED ORDER — PROPOFOL 10 MG/ML IV BOLUS
INTRAVENOUS | Status: AC
  Filled 2020-09-13: qty 20

## 2020-09-13 MED ORDER — SCOPOLAMINE 1 MG/3DAYS TD PT72
1.0000 | MEDICATED_PATCH | Freq: Once | TRANSDERMAL | Status: DC
  Administered 2020-09-13: 1.5 mg via TRANSDERMAL
  Filled 2020-09-13: qty 1

## 2020-09-13 MED ORDER — CHLORHEXIDINE GLUCONATE 0.12 % MT SOLN
15.0000 mL | Freq: Once | OROMUCOSAL | Status: AC
  Administered 2020-09-13: 15 mL via OROMUCOSAL
  Filled 2020-09-13: qty 15

## 2020-09-13 MED ORDER — CEFAZOLIN SODIUM-DEXTROSE 2-4 GM/100ML-% IV SOLN
2.0000 g | INTRAVENOUS | Status: AC
  Administered 2020-09-13: 2 g via INTRAVENOUS
  Filled 2020-09-13: qty 100

## 2020-09-13 MED ORDER — LIDOCAINE HCL (CARDIAC) PF 100 MG/5ML IV SOSY
PREFILLED_SYRINGE | INTRAVENOUS | Status: DC | PRN
  Administered 2020-09-13: 60 mg via INTRAVENOUS

## 2020-09-13 MED ORDER — FENTANYL CITRATE (PF) 100 MCG/2ML IJ SOLN
INTRAMUSCULAR | Status: AC
  Filled 2020-09-13: qty 2

## 2020-09-13 MED ORDER — FENTANYL CITRATE (PF) 100 MCG/2ML IJ SOLN
INTRAMUSCULAR | Status: DC | PRN
Start: 2020-09-13 — End: 2020-09-13
  Administered 2020-09-13: 50 ug via INTRAVENOUS
  Administered 2020-09-13: 100 ug via INTRAVENOUS

## 2020-09-13 MED ORDER — EPHEDRINE SULFATE 50 MG/ML IJ SOLN
INTRAMUSCULAR | Status: DC | PRN
  Administered 2020-09-13: 10 mg via INTRAVENOUS

## 2020-09-13 MED ORDER — GLYCOPYRROLATE PF 0.2 MG/ML IJ SOSY
PREFILLED_SYRINGE | INTRAMUSCULAR | Status: AC
  Filled 2020-09-13: qty 1

## 2020-09-13 MED ORDER — CHLORHEXIDINE GLUCONATE CLOTH 2 % EX PADS: 6.0000 | MEDICATED_PAD | Freq: Once | CUTANEOUS | Status: DC

## 2020-09-13 MED ORDER — KETOROLAC TROMETHAMINE 30 MG/ML IJ SOLN
30.0000 mg | Freq: Once | INTRAMUSCULAR | Status: AC
  Administered 2020-09-13: 30 mg via INTRAVENOUS
  Filled 2020-09-13: qty 1

## 2020-09-13 MED ORDER — MIDAZOLAM HCL 2 MG/2ML IJ SOLN
INTRAMUSCULAR | Status: AC
  Filled 2020-09-13: qty 2

## 2020-09-13 MED ORDER — ONDANSETRON HCL 4 MG/2ML IJ SOLN
INTRAMUSCULAR | Status: DC | PRN
  Administered 2020-09-13: 4 mg via INTRAVENOUS

## 2020-09-13 MED ORDER — HEMOSTATIC AGENTS (NO CHARGE) OPTIME
TOPICAL | Status: DC | PRN
  Administered 2020-09-13: 1 via TOPICAL

## 2020-09-13 MED ORDER — SUGAMMADEX SODIUM 500 MG/5ML IV SOLN
INTRAVENOUS | Status: DC | PRN
  Administered 2020-09-13: 200 mg via INTRAVENOUS

## 2020-09-13 MED ORDER — ROCURONIUM BROMIDE 10 MG/ML (PF) SYRINGE
PREFILLED_SYRINGE | INTRAVENOUS | Status: AC
  Filled 2020-09-13: qty 10

## 2020-09-13 MED ORDER — ORAL CARE MOUTH RINSE: 15.0000 mL | Freq: Once | OROMUCOSAL | Status: AC

## 2020-09-13 MED ORDER — HYDROMORPHONE HCL 1 MG/ML IJ SOLN: 0.2500 mg | INTRAMUSCULAR | Status: DC | PRN

## 2020-09-13 MED ORDER — MIDAZOLAM HCL 5 MG/5ML IJ SOLN
INTRAMUSCULAR | Status: DC | PRN
  Administered 2020-09-13: 2 mg via INTRAVENOUS

## 2020-09-13 MED ORDER — ONDANSETRON HCL 4 MG/2ML IJ SOLN
INTRAMUSCULAR | Status: AC
  Filled 2020-09-13: qty 2

## 2020-09-13 MED ORDER — PROPOFOL 10 MG/ML IV BOLUS
INTRAVENOUS | Status: DC | PRN
  Administered 2020-09-13: 170 mg via INTRAVENOUS

## 2020-09-13 MED ORDER — DEXAMETHASONE SODIUM PHOSPHATE 10 MG/ML IJ SOLN
INTRAMUSCULAR | Status: DC | PRN
  Administered 2020-09-13: 5 mg via INTRAVENOUS

## 2020-09-13 MED ORDER — BUPIVACAINE LIPOSOME 1.3 % IJ SUSP
INTRAMUSCULAR | Status: AC
  Filled 2020-09-13: qty 20

## 2020-09-13 MED ORDER — LACTATED RINGERS IV SOLN: INTRAVENOUS | Status: DC | PRN

## 2020-09-13 MED ORDER — TRAMADOL HCL 50 MG PO TABS: 50.0000 mg | ORAL_TABLET | Freq: Four times a day (QID) | ORAL | 0 refills | Status: DC | PRN

## 2020-09-13 MED ORDER — PROMETHAZINE HCL 25 MG/ML IJ SOLN: 6.2500 mg | INTRAMUSCULAR | Status: DC | PRN

## 2020-09-13 MED ORDER — MEPERIDINE HCL 50 MG/ML IJ SOLN: 6.2500 mg | INTRAMUSCULAR | Status: DC | PRN

## 2020-09-13 MED ORDER — GLYCOPYRROLATE 0.2 MG/ML IJ SOLN
INTRAMUSCULAR | Status: DC | PRN
  Administered 2020-09-13: .2 mg via INTRAVENOUS

## 2020-09-13 MED ORDER — ROCURONIUM BROMIDE 100 MG/10ML IV SOLN
INTRAVENOUS | Status: DC | PRN
  Administered 2020-09-13: 50 mg via INTRAVENOUS

## 2020-09-13 MED ORDER — BUPIVACAINE LIPOSOME 1.3 % IJ SUSP
INTRAMUSCULAR | Status: DC | PRN
  Administered 2020-09-13: 20 mL

## 2020-09-13 MED ORDER — LIDOCAINE 2% (20 MG/ML) 5 ML SYRINGE
INTRAMUSCULAR | Status: AC
  Filled 2020-09-13: qty 5

## 2020-09-13 MED ORDER — DEXAMETHASONE SODIUM PHOSPHATE 10 MG/ML IJ SOLN
INTRAMUSCULAR | Status: AC
  Filled 2020-09-13: qty 1

## 2020-09-13 SURGICAL SUPPLY — 48 items
ADH SKN CLS APL DERMABOND .7 (GAUZE/BANDAGES/DRESSINGS) ×1
APL SRG 38 LTWT LNG FL B (MISCELLANEOUS)
APPLICATOR ARISTA FLEXITIP XL (MISCELLANEOUS) IMPLANT
APPLIER CLIP ROT 10 11.4 M/L (STAPLE) ×2
APR CLP MED LRG 11.4X10 (STAPLE) ×1
BAG RETRIEVAL 10 (BASKET) ×1
CLIP APPLIE ROT 10 11.4 M/L (STAPLE) ×1 IMPLANT
CLOTH BEACON ORANGE TIMEOUT ST (SAFETY) ×2 IMPLANT
COVER LIGHT HANDLE STERIS (MISCELLANEOUS) ×4 IMPLANT
COVER WAND RF STERILE (DRAPES) ×2 IMPLANT
DERMABOND ADVANCED (GAUZE/BANDAGES/DRESSINGS) ×1
DERMABOND ADVANCED .7 DNX12 (GAUZE/BANDAGES/DRESSINGS) ×1 IMPLANT
DURAPREP 26ML APPLICATOR (WOUND CARE) ×2 IMPLANT
ELECT REM PT RETURN 9FT ADLT (ELECTROSURGICAL) ×2
ELECTRODE REM PT RTRN 9FT ADLT (ELECTROSURGICAL) ×1 IMPLANT
GLOVE BIOGEL PI IND STRL 6.5 (GLOVE) ×1 IMPLANT
GLOVE BIOGEL PI IND STRL 7.0 (GLOVE) ×3 IMPLANT
GLOVE BIOGEL PI IND STRL 7.5 (GLOVE) ×1 IMPLANT
GLOVE BIOGEL PI INDICATOR 6.5 (GLOVE) ×1
GLOVE BIOGEL PI INDICATOR 7.0 (GLOVE) ×3
GLOVE BIOGEL PI INDICATOR 7.5 (GLOVE) ×1
GLOVE ECLIPSE 6.5 STRL STRAW (GLOVE) ×2 IMPLANT
GLOVE SURG SS PI 7.5 STRL IVOR (GLOVE) ×2 IMPLANT
GOWN STRL REUS W/TWL LRG LVL3 (GOWN DISPOSABLE) ×6 IMPLANT
HEMOSTAT ARISTA ABSORB 3G PWDR (HEMOSTASIS) IMPLANT
HEMOSTAT SNOW SURGICEL 2X4 (HEMOSTASIS) ×2 IMPLANT
INST SET LAPROSCOPIC AP (KITS) ×2 IMPLANT
KIT TURNOVER KIT A (KITS) ×2 IMPLANT
MANIFOLD NEPTUNE II (INSTRUMENTS) ×2 IMPLANT
NEEDLE HYPO 18GX1.5 BLUNT FILL (NEEDLE) ×2 IMPLANT
NEEDLE HYPO 21X1.5 SAFETY (NEEDLE) ×2 IMPLANT
NEEDLE INSUFFLATION 14GA 120MM (NEEDLE) ×2 IMPLANT
NS IRRIG 1000ML POUR BTL (IV SOLUTION) ×2 IMPLANT
PACK LAP CHOLE LZT030E (CUSTOM PROCEDURE TRAY) ×2 IMPLANT
PAD ARMBOARD 7.5X6 YLW CONV (MISCELLANEOUS) ×2 IMPLANT
SET BASIN LINEN APH (SET/KITS/TRAYS/PACK) ×2 IMPLANT
SET TUBE SMOKE EVAC HIGH FLOW (TUBING) ×2 IMPLANT
SLEEVE ENDOPATH XCEL 5M (ENDOMECHANICALS) ×2 IMPLANT
SUT MNCRL AB 4-0 PS2 18 (SUTURE) ×4 IMPLANT
SUT VICRYL 0 UR6 27IN ABS (SUTURE) ×2 IMPLANT
SYR 20ML LL LF (SYRINGE) ×4 IMPLANT
SYS BAG RETRIEVAL 10MM (BASKET) ×1
SYSTEM BAG RETRIEVAL 10MM (BASKET) ×1 IMPLANT
TROCAR ENDO BLADELESS 11MM (ENDOMECHANICALS) ×2 IMPLANT
TROCAR XCEL NON-BLD 5MMX100MML (ENDOMECHANICALS) ×2 IMPLANT
TROCAR XCEL UNIV SLVE 11M 100M (ENDOMECHANICALS) ×2 IMPLANT
TUBE CONNECTING 12X1/4 (SUCTIONS) ×2 IMPLANT
WARMER LAPAROSCOPE (MISCELLANEOUS) ×2 IMPLANT

## 2020-09-13 NOTE — Discharge Instructions (Signed)
[facility] THE [facility] UNTIL Tuesday September 17, 2020. DO NOT USE ANY FURTHER NUMBING MEDICATIONS UNTIL AFTER September 17, 2020.   PLEASE REMOVE THE SCOPOLAMINE PATCH BEHIND YOUR EAR ON Monday September 16, 2020. THIS HELPS CONTROL NAUSEA. [facility] HANDS AFTER REMOVAL       Laparoscopic Cholecystectomy, Care After This sheet gives you information about how to care for yourself after your procedure. Your health care provider may also give you more specific instructions. If you have problems or questions, contact your health care provider. What can I expect after the procedure? After the procedure, it is common to have:  Pain at your incision sites. You will be given medicines to control this pain.  Mild nausea or vomiting.  Bloating and possible shoulder pain from the air-like gas that was used during the procedure. Follow these instructions at home: Incision care   Follow instructions from your health care provider about how to take care of your incisions. Make sure you: ? Wash your hands with soap and water before you change your bandage (dressing). If soap and water are not available, use hand sanitizer. ? Change your dressing as told by your health care provider. ? Leave stitches (sutures), skin glue, or adhesive strips in place. These skin closures may need to be in place for 2 weeks or longer. If adhesive strip edges start to loosen and curl up, you may trim the loose edges. Do not remove adhesive strips completely unless your health care provider tells you to do that.  Do not take baths, swim, or use a hot tub until your health care provider approves. Ask your health care provider if you can take showers. You may only be allowed to take sponge baths for bathing.  Check your incision area every day for signs of infection. Check for: ? More redness, swelling, or pain. ? More fluid or blood. ? Warmth. ? Pus or a bad smell. Activity  Do not drive or use heavy  machinery while taking prescription pain medicine.  Do not lift anything that is heavier than 10 lb (4.5 kg) until your health care provider approves.  Do not play contact sports until your health care provider approves.  Do not drive for 24 hours if you were given a medicine to help you relax (sedative).  Rest as needed. Do not return to work or school until your health care provider approves. General instructions  Take over-the-counter and prescription medicines only as told by your health care provider.  To prevent or treat constipation while you are taking prescription pain medicine, your health care provider may recommend that you: ? Drink enough fluid to keep your urine clear or pale yellow. ? Take over-the-counter or prescription medicines. ? Eat foods that are high in fiber, such as fresh fruits and vegetables, whole grains, and beans. ? Limit foods that are high in fat and processed sugars, such as fried and sweet foods. Contact a health care provider if:  You develop a rash.  You have more redness, swelling, or pain around your incisions.  You have more fluid or blood coming from your incisions.  Your incisions feel warm to the touch.  You have pus or a bad smell coming from your incisions.  You have a fever.  One or more of your incisions breaks open. Get help right away if:  You have trouble breathing.  You have chest pain.  You have increasing pain in your shoulders.  You faint or feel dizzy  when you stand.  You have severe pain in your abdomen.  You have nausea or vomiting that lasts for more than one day.  You have leg pain. This information is not intended to replace advice given to you by your health care provider. Make sure you discuss any questions you have with your health care provider. Document Revised: 10/29/2017 Document Reviewed: 05/04/2016 Elsevier Patient Education  2020 Else[employer] Inc.    General Anesthesia, Adult, Care After This  sheet gives you information about how to care for yourself after your procedure. Your health care provider may also give you more specific instructions. If you have problems or questions, contact your health care provider. What can I expect after the procedure? After the procedure, the following side effects are common:  Pain or discomfort at the IV site.  Nausea.  Vomiting.  Sore throat.  Trouble concentrating.  Feeling cold or chills.  Weak or tired.  Sleepiness and fatigue.  Soreness and body aches. These side effects can affect parts of the body that were not involved in surgery. Follow these instructions at home:  For at least 24 hours after the procedure:  Have a responsible adult stay with you. It is important to have someone help care for you until you are awake and alert.  Rest as needed.  Do not: ? Participate in activities in which you could fall or become injured. ? Drive. ? Use heavy machinery. ? Drink alcohol. ? Take sleeping pills or medicines that cause drowsiness. ? Make important decisions or sign legal documents. ? Take care of children on your own. Eating and drinking  Follow any instructions from your health care provider about eating or drinking restrictions.  When you feel hungry, start by eating small amounts of foods that are soft and easy to digest (bland), such as toast. Gradually return to your regular diet.  Drink enough fluid to keep your urine pale yellow.  If you vomit, rehydrate by drinking water, juice, or clear broth. General instructions  If you have sleep apnea, surgery and certain medicines can increase your risk for breathing problems. Follow instructions from your health care provider about wearing your sleep device: ? Anytime you are sleeping, including during daytime naps. ? While taking prescription pain medicines, sleeping medicines, or medicines that make you drowsy.  Return to your normal activities as told by your  health care provider. Ask your health care provider what activities are safe for you.  Take over-the-counter and prescription medicines only as told by your health care provider.  If you smoke, do not smoke without supervision.  Keep all follow-up visits as told by your health care provider. This is important. Contact a health care provider if:  You have nausea or vomiting that does not get better with medicine.  You cannot eat or drink without vomiting.  You have pain that does not get better with medicine.  You are unable to pass urine.  You develop a skin rash.  You have a fever.  You have redness around your IV site that gets worse. Get help right away if:  You have difficulty breathing.  You have chest pain.  You have blood in your urine or stool, or you vomit blood. Summary  After the procedure, it is common to have a sore throat or nausea. It is also common to feel tired.  Have a responsible adult stay with you for the first 24 hours after general anesthesia. It is important to have someone help care  for you until you are awake and alert.  When you feel hungry, start by eating small amounts of foods that are soft and easy to digest (bland), such as toast. Gradually return to your regular diet.  Drink enough fluid to keep your urine pale yellow.  Return to your normal activities as told by your health care provider. Ask your health care provider what activities are safe for you. This information is not intended to replace advice given to you by your health care provider. Make sure you discuss any questions you have with your health care provider. Document Revised: 11/19/2017 Document Reviewed: 07/02/2017 Elsevier Patient Education  2020 [employer].

## 2020-09-13 NOTE — Transfer of Care (Signed)
Immediate Anesthesia Transfer of Care Note  Patient: Sabrina [name]  Procedure(s) Performed: LAPAROSCOPIC CHOLECYSTECTOMY (N/A )  Patient Location: PACU  Anesthesia Type:General  Level of Consciousness: awake, alert , oriented and patient cooperative  Airway & Oxygen Therapy: Patient Spontanous Breathing and Patient connected to nasal cannula oxygen  Post-op Assessment: Report given to RN, Post -op Vital signs reviewed and stable and Patient moving all extremities X 4  Post vital signs: Reviewed and stable  Last Vitals:  Vitals Value Taken Time  BP 106/54 09/13/20 0817  Temp    Pulse 88 09/13/20 0819  Resp 18 09/13/20 0819  SpO2 97 % 09/13/20 0819  Vitals shown include unvalidated device data.  Last Pain:  Vitals:   09/13/20 0645  TempSrc: Oral  PainSc: 0-No pain      Patients Stated Pain Goal: 5 (09/13/20 0645)  Complications: No complications documented.

## 2020-09-13 NOTE — Anesthesia Procedure Notes (Signed)
Procedure Name: Intubation Date/Time: 09/13/2020 7:30 AM Performed by: Jonna Munro, CRNA Pre-anesthesia Checklist: Patient identified, Emergency Drugs available, Suction available, Patient being monitored and Timeout performed Patient Re-evaluated:Patient Re-evaluated prior to induction Oxygen Delivery Method: Circle system utilized Preoxygenation: Pre-oxygenation with 100% oxygen Induction Type: IV induction Laryngoscope Size: Mac and 3 Grade View: Grade I Tube type: Oral Tube size: 7.0 mm Number of attempts: 1 Airway Equipment and Method: Stylet Placement Confirmation: ETT inserted through vocal cords under direct vision,  positive ETCO2 and breath sounds checked- equal and bilateral Secured at: 22 cm Tube secured with: Tape Dental Injury: Teeth and Oropharynx as per pre-operative assessment

## 2020-09-13 NOTE — Interval H&P Note (Signed)
History and Physical Interval Note:  09/13/2020 7:12 AM  Sabrina [name]  has presented today for surgery, with the diagnosis of Chronic cholecystitis.  The various methods of treatment have been discussed with the patient and family. After consideration of risks, benefits and other options for treatment, the patient has consented to  Procedure(s): LAPAROSCOPIC CHOLECYSTECTOMY (N/A) as a surgical intervention.  The patient's history has been reviewed, patient examined, no change in status, stable for surgery.  I have reviewed the patient's chart and labs.  Questions were answered to the patient's satisfaction.     Franky Macho

## 2020-09-13 NOTE — Anesthesia Postprocedure Evaluation (Signed)
Anesthesia Post Note  Patient: Sabrina [name]  Procedure(s) Performed: LAPAROSCOPIC CHOLECYSTECTOMY (N/A )  Patient location during evaluation: PACU Anesthesia Type: General Level of consciousness: awake, oriented and awake and alert Pain management: satisfactory to patient Vital Signs Assessment: post-procedure vital signs reviewed and stable Respiratory status: spontaneous breathing, respiratory function stable and nonlabored ventilation Cardiovascular status: stable Postop Assessment: no apparent nausea or vomiting Anesthetic complications: no   No complications documented.   Last Vitals:  Vitals:   09/13/20 0645 09/13/20 0820  BP: 128/80 (!) 106/54  Pulse: 75 88  Resp: 15 18  Temp: 36.9 C (P) 36.8 C  SpO2: 98% 97%    Last Pain:  Vitals:   09/13/20 0645  TempSrc: Oral  PainSc: 0-No pain                 Lauraann Missey

## 2020-09-13 NOTE — Anesthesia Preprocedure Evaluation (Addendum)
Anesthesia Evaluation  Patient identified by MRN, date of birth, ID band Patient awake    Reviewed: Allergy & Precautions, NPO status , Patient's Chart, lab work & pertinent test results  History of Anesthesia Complications (+) PONV and history of anesthetic complications  Airway Mallampati: I  TM Distance: >3 FB Neck ROM: Full    Dental  (+) Dental Advisory Given, Teeth Intact   Pulmonary    Pulmonary exam normal breath sounds clear to auscultation       Cardiovascular Exercise Tolerance: Good negative cardio ROS Normal cardiovascular exam Rhythm:Regular Rate:Normal     Neuro/Psych Anxiety    GI/Hepatic Neg liver ROS, hiatal hernia, GERD  ,  Endo/Other  Hypothyroidism   Renal/GU negative Renal ROS  negative genitourinary   Musculoskeletal negative musculoskeletal ROS (+)   Abdominal   Peds negative pediatric ROS (+)  Hematology negative hematology ROS (+)   Anesthesia Other Findings Nasal congestion from allergies   Reproductive/Obstetrics negative OB ROS                           Anesthesia Physical Anesthesia Plan  ASA: II  Anesthesia Plan: General   Post-op Pain Management:    Induction: Intravenous  PONV Risk Score and Plan: Midazolam, Dexamethasone, Ondansetron and Scopolamine patch - Pre-op  Airway Management Planned: Oral ETT  Additional Equipment:   Intra-op Plan:   Post-operative Plan: Extubation in OR  Informed Consent: I have reviewed the patients History and Physical, chart, labs and discussed the procedure including the risks, benefits and alternatives for the proposed anesthesia with the patient or authorized representative who has indicated his/her understanding and acceptance.     Dental advisory given  Plan Discussed with: CRNA and Surgeon  Anesthesia Plan Comments:         Anesthesia Quick Evaluation

## 2020-09-13 NOTE — Op Note (Signed)
Patient:  Sabrina [name]  DOB:  30-Oct-1971  MRN:  [id number]   Preop Diagnosis: Biliary dyskinesia  Postop Diagnosis: Same  Procedure: Laparoscopic cholecystectomy  Surgeon: Franky Macho, MD  Anes: General endotracheal  Indications: Patient is a 49 year old white female who presents with biliary dyskinesia. The risks and benefits of the procedure including bleeding, infection, hepatobiliary injury, and the possibility of an open procedure were fully explained to the patient, who gave informed consent.  Procedure note: The patient was placed in the supine position. After induction of general endotracheal anesthesia, the abdomen was prepped and draped using the usual sterile technique with ChloraPrep. Surgical site confirmation was performed.  A supraumbilical incision was made down to the fascia. A Veress needle was introduced into the abdominal cavity and confirmation of placement was done using the saline drop test. The abdomen was then insufflated to 15 mmHg pressure. An 11 mm trocar was introduced into the abdominal cavity under direct visualization without difficulty. The patient was placed in reverse Trendelenburg position and an additional 11 mm trocar was placed in the epigastric region and 5 mm trochars were placed in the right upper quadrant and right flank regions. The liver was inspected and noted to be within normal limits. The gallbladder was retracted in a dynamic fashion in order to provide a critical view of the triangle of Calot. The cystic duct was first identified. Its juncture to the infundibulum was fully identified. Endoclips were placed proximally and distally on the cystic duct, and the cystic duct was divided. This was likewise done the cystic artery. The gallbladder was freed away from the gallbladder fossa using Bovie electrocautery. The gallbladder was delivered through the epigastric trocar site using an Endo Catch bag. The gallbladder fossa was inspected and no  abnormal bleeding or bile leakage was noted. Surgicel snow was placed on the gallbladder fossa. All fluid and air were then evacuated from the abdominal cavity prior to removal of the trochars.  All wounds were irrigated with normal saline. All wounds were injected with Exparel. The supraumbilical fascia was reapproximated using an 0 Vicryl interrupted suture. All skin incisions were closed using a 4-0 Monocryl subcuticular suture. Dermabond was applied.  All tape and needle counts were correct at the end of the procedure. The patient was extubated in the operating room and transferred to the PACU in stable condition.  Complications: None  EBL: Minimal  Specimen: Gallbladder

## 2020-09-16 ENCOUNTER — Encounter (HOSPITAL_COMMUNITY): Payer: Self-pay | Admitting: General Surgery

## 2020-09-16 LAB — SURGICAL PATHOLOGY

## 2020-09-26 ENCOUNTER — Telehealth (INDEPENDENT_AMBULATORY_CARE_PROVIDER_SITE_OTHER): Payer: BC Managed Care – PPO | Admitting: General Surgery

## 2020-09-26 ENCOUNTER — Encounter: Payer: Self-pay | Admitting: General Surgery

## 2020-09-26 DIAGNOSIS — Z09 Encounter for follow-up examination after completed treatment for conditions other than malignant neoplasm: Secondary | ICD-10-CM

## 2020-09-26 NOTE — Progress Notes (Signed)
Subjective:     Sabrina [name]  Virtual telephone visit performed.  I was in my office and she was on her cell phone.  She agreed to the postoperative telephone visit.  She states she is doing well.  Her preoperative symptoms have resolved.  She denies any fever or chills.  She states her incisions are healing well. Objective:    LMP 09/03/2020 (Exact Date)   General:  alert, cooperative and no distress  Final pathology consistent with diagnosis.     Assessment:    Doing well postoperatively.    Plan:   She will call Dr. Karilyn Cota of GI for a follow-up upper endoscopy due to her history of reflux and a hiatal hernia.  She is also contacting an endocrinologist concerning her thyroid disease.  Follow-up with me as needed.  Total telephone time 2 minutes.

## 2020-10-10 ENCOUNTER — Ambulatory Visit: Payer: BC Managed Care – PPO | Admitting: Internal Medicine

## 2020-10-10 ENCOUNTER — Other Ambulatory Visit: Payer: Self-pay

## 2020-10-10 ENCOUNTER — Encounter: Payer: Self-pay | Admitting: Internal Medicine

## 2020-10-10 VITALS — BP 120/82 | HR 77 | Ht 64.0 in | Wt 166.0 lb

## 2020-10-10 DIAGNOSIS — E038 Other specified hypothyroidism: Secondary | ICD-10-CM | POA: Diagnosis not present

## 2020-10-10 DIAGNOSIS — Z8639 Personal history of other endocrine, nutritional and metabolic disease: Secondary | ICD-10-CM

## 2020-10-10 DIAGNOSIS — E063 Autoimmune thyroiditis: Secondary | ICD-10-CM | POA: Diagnosis not present

## 2020-10-10 LAB — TSH: TSH: 0.87 u[IU]/mL (ref 0.35–4.50)

## 2020-10-10 LAB — T4, FREE: Free T4: 1.19 ng/dL (ref 0.60–1.60)

## 2020-10-10 NOTE — Progress Notes (Signed)
Patient ID: Sabrina [name], female   DOB: Jun 20, 1971, 49 y.o.   MRN: [id number]   This visit occurred during the SARS-CoV-2 public health emergency.  Safety protocols were in place, including screening questions prior to the visit, additional usage of staff PPE, and extensive cleaning of exam room while observing appropriate contact time as indicated for disinfecting solutions.   HPI  Sabrina [name] is a 49 y.o.-year-old female, referred by her PCP, Dr. Norval Gable (PCP in [place]), for management of Hashimoto's hypothyroidism.  She also has a history of thyroid nodule.  She previously saw several endocrinologists. She lives in [place].  She sees Dr. Vincente Poli.  Pt. has been dx with hypothyroidism in 2014, during investigation for fatigue. At that time, she had thyroid imaging and was found to have thyroid nodules.  She is on levothyroxine 100 mcg. - fasting - with water - separated by >30 min from b'fast  - no calcium, iron - + multivitamins later in the day - + on Protonix 40 mg daily at bedtime On vit C, D, Zn.  I reviewed pt's thyroid tests: 07/04/2020: TSH 1.27 03/21/2020: TSH 1.06, free T4 1.4 02/23/2020: TSH 1.34 (0.45-4.5), free T4 1.37, total T3 101 04/10/2014: TSH 0.86 (0.34-5.66), free T4 1.12 (0.52-1.21) No results found for: TSH, FREET4, T3FREE  Antithyroid antibodies: 03/21/2020: TPO antibodies 169 02/23/2020: TPO antibodies 190 (0-34) No results found for: THGAB No components found for: TPOAB  Pt describes: - weight gain - fatigue - cold intolerance - depression - constipation - dry skin - hair loss  Thyroid nodule:  Reviewed previous investigation: Thyroid ultrasound (02/2013): 1.7 x 1.03 cm right mid thyroid nodule Thyroid ultrasound in office-Dr. Esclamado (08/2013): No obvious thyroid nodule Thyroid ultrasound (04/10/2014): Heterogeneous, thyroid lobes, without thyroid nodules.  Small lymph nodes, benign-appearing.  Thyroid ultrasound (02/20/2020):  Heterogeneous thyroid with increased vascularity throughout the entire gland, no nodules  Pt denies: - feeling nodules in neck - dysphagia - choking - SOB with lying down But she describes increased fullness in her neck and hoarseness after having COVID-19 in 01/2020.  Interestingly, the fullness in her neck improved after her gallbladder surgery 3 weeks ago.  She still has some hoarseness.  She has + FH of thyroid disorders in: PGM. 3 great aunts with thyroid ds. No FH of thyroid cancer.  No h/o radiation tx to head or neck. No recent use of iodine supplements.  Pt. also has a history of GB surgery 08/2020.  She has a h/o 2 miscarriages. She had + ANA.  She saw rheumatology in the past.  She has a history of kidney stones, acid reflux  ROS: Constitutional: no weight gain/loss, + fatigue, no subjective hyperthermia/hypothermia Eyes: no blurry vision, no xerophthalmia ENT: no sore throat, + see HPI Cardiovascular: no CP/SOB/palpitations/leg swelling Respiratory: no cough/SOB Gastrointestinal: no N/V/+ D/ + C Musculoskeletal: + Both: Muscle/joint aches Skin: no rashes Neurological: no tremors/numbness/tingling/dizziness, + headaches Psychiatric: no depression/+ anxiety  Past Medical History:  Diagnosis Date  . Anxiety   . GERD (gastroesophageal reflux disease)   . History of hiatal hernia   . History of kidney stones   . Hypothyroidism   . PONV (postoperative nausea and vomiting)    Past Surgical History:  Procedure Laterality Date  . CESAREAN SECTION  2011  . CHOLECYSTECTOMY N/A 09/13/2020   Procedure: LAPAROSCOPIC CHOLECYSTECTOMY;  Surgeon: Franky Macho, MD;  Location: [facility];  Service: General;  Laterality: N/A;  . HERNIA REPAIR  2015  . LASIK  2019  .  LITHOTRIPSY  2017  . NASAL SINUS SURGERY     x2  . TUBAL LIGATION     Social History   Socioeconomic History  . Marital status: Married    Spouse name: Not on file  . Number of children: 2  . Years of  education: Not on file  . Highest education level: Not on file  Occupational History  . Occupation: Print production plannerffice manager  Tobacco Use  . Smoking status: Never Smoker  . Smokeless tobacco: Never Used  Substance and Sexual Activity  . Alcohol use: Never  . Drug use: Never  . Sexual activity: Not on file  Other Topics Concern  . Not on file  Social History Narrative   She has 1 adopted child, 48 years old in 09/2020   Social Determinants of Health   Financial Resource Strain:   . Difficulty of Paying Living Expenses: Not on file  Food Insecurity:   . Worried About Programme researcher, broadcasting/film/videounning Out of Food in the Last Year: Not on file  . Ran Out of Food in the Last Year: Not on file  Transportation Needs:   . Lack of Transportation (Medical): Not on file  . Lack of Transportation (Non-Medical): Not on file  Physical Activity:   . Days of Exercise per Week: Not on file  . Minutes of Exercise per Session: Not on file  Stress:   . Feeling of Stress : Not on file  Social Connections:   . Frequency of Communication with Friends and Family: Not on file  . Frequency of Social Gatherings with Friends and Family: Not on file  . Attends Religious Services: Not on file  . Active Member of Clubs or Organizations: Not on file  . Attends BankerClub or Organization Meetings: Not on file  . Marital Status: Not on file  Intimate Partner Violence:   . Fear of Current or Ex-Partner: Not on file  . Emotionally Abused: Not on file  . Physically Abused: Not on file  . Sexually Abused: Not on file   Current Outpatient Medications on File Prior to Visit  Medication Sig Dispense Refill  . OMEPRAZOLE PO Take 40 mg by mouth.    . Ascorbic Acid (VITAMIN C) 1000 MG tablet Take 1,000 mg by mouth daily.    . busPIRone (BUSPAR) 7.5 MG tablet Take 7.5 mg by mouth 2 (two) times daily.     . cholecalciferol (VITAMIN D3) 25 MCG (1000 UNIT) tablet Take 1,000 Units by mouth daily.    Marland Kitchen. eletriptan (RELPAX) 20 MG tablet Take 20 mg by mouth as  needed for migraine or headache. May repeat in 2 hours if headache persists or recurs.    Marland Kitchen. FLUoxetine (PROZAC) 10 MG capsule Take 10 mg by mouth daily.    Marland Kitchen. levocetirizine (XYZAL) 5 MG tablet Take 5 mg by mouth every evening.    Marland Kitchen. levothyroxine (SYNTHROID) 100 MCG tablet Take 100 mcg by mouth daily before breakfast.     . pantoprazole (PROTONIX) 40 MG tablet Take 40 mg by mouth daily.     Marland Kitchen. zinc gluconate 50 MG tablet Take 50 mg by mouth daily.     No current facility-administered medications on file prior to visit.   Allergies  Allergen Reactions  . Erythromycin Nausea And Vomiting  . Nitrofurantoin Palpitations   Family History  Problem Relation Age of Onset  . Cancer Mother   . Heart disease Father   . Diabetes Maternal Grandfather   . Thyroid disease Paternal Grandmother    +  See HPI  PE: BP 120/82   Pulse 77   Ht 5\' 4"  (1.626 m)   Wt 166 lb (75.3 kg)   SpO2 98%   BMI 28.49 kg/m  Wt Readings from Last 3 Encounters:  09/12/20 165 lb (74.8 kg)  08/29/20 167 lb (75.8 kg)   Constitutional: Normal weight, in NAD Eyes: PERRLA, EOMI, no exophthalmos ENT: moist mucous membranes, + slight symmetric thyromegaly, no cervical lymphadenopathy Cardiovascular: RRR, No MRG Respiratory: CTA B Gastrointestinal: abdomen soft, NT, ND, BS+ Musculoskeletal: no deformities, strength intact in all 4 Skin: moist, warm, no rashes Neurological: no tremor with outstretched hands, DTR normal in all 4  ASSESSMENT: 1.  Hashimoto's hypothyroidism  2.  History of thyroid nodules  PLAN:  1. Patient with long-standing hypothyroidism, on 100 mcg levothyroxine daily.  We reviewed her previous TFTs in the last year and I explained that these are excellent, so the above dose appears to be adequate for her.  Also, we reviewed her antithyroid antibodies and they were elevated.  We discussed about Hashimoto's thyroiditis as being an autoimmune disorder, in which she develops antibodies against her own  thyroid. The antibodies bind to the thyroid tissue and cause inflammation, and, eventually, destruction of the gland and hypothyroidism. I also explained that thyroid enlargement is not uncommon in the setting of elevated thyroid antibodies, and it has a waxing and waning character.  - We discussed about treatment for Hashimoto thyroiditis, which is actually limited to thyroid hormones in case her TFTs are abnormal. Supplements like selenium has been tried with various results, some showing improvement in the TPO antibodies. However, there are no randomized controlled trials of this are consistent results between trials. We also discussed about ways to improve her immune system (relaxation, diet, exercise, sleep) to reduce the Ab titer and, subsequently, the thyroid inflammation.  We did discuss about starting selenium in case her antibodies remain elevated. - she appears euthyroid but does mention fatigue, mental fog.  She is also perimenopausal. - We discussed about correct intake of levothyroxine, fasting, with water, separated by at least 30 minutes from breakfast, and separated by more than 4 hours from calcium, iron, multivitamins, acid reflux medications (PPIs).  She is taking it correctly. - will check thyroid tests today: TSH, free T4.  - If labs today are abnormal, she will need to return in ~6 weeks for repeat labs - Otherwise, I will see her back in 4 months  2.  History of thyroid nodules -She had one dominant right mid thyroid nodule on an initial ultrasound obtained in 02/2013, but 3 subsequent ultrasounds including earlier this year only showed signs of inflammation, as expected in the setting of Hashimoto's thyroiditis, but did not show any nodules -She has had some neck compression symptoms after her COVID-19 infection we discussed about the fact that this could have been due to stimulation of her immune system with cosecretion of antithyroid antibodies and subsequent thyroid  inflammation.  The symptoms are improving. -No family history of thyroid cancer or history of radiation therapy to head or neck -No further investigation is needed for this  Component     Latest Ref Rng & Units 10/10/2020  TSH     0.35 - 4.50 uIU/mL 0.87  T4,Free(Direct)     0.60 - 1.60 ng/dL 13/09/2020  Thyroglobulin Ab     < or = 1 IU/mL 2 (H)  Thyroperoxidase Ab SerPl-aCnc     <9 IU/mL 86 (H)   Her TPO antibodies are  better, while thyroid function tests remain normal.  However, I would still suggest to try selenium 200 mcg daily to see if she feels better and we can recheck her antibodies at next visit.  Carlus Pavlov, MD PhD [facility] Endocrinology

## 2020-10-10 NOTE — Patient Instructions (Addendum)
Please stop at the lab.  Please continue Levothyroxine 100 mcg daily.  Take the thyroid hormone every day, with water, at least 30 minutes before breakfast, separated by at least 4 hours from: - acid reflux medications - calcium - iron - multivitamins  Please come back for a follow-up appointment in 4-6 months.   Hypothyroidism  Hypothyroidism is when the thyroid gland does not make enough of certain hormones (it is underactive). The thyroid gland is a small gland located in the lower front part of the neck, just in front of the windpipe (trachea). This gland makes hormones that help control how the body uses food for energy (metabolism) as well as how the heart and brain function. These hormones also play a role in keeping your bones strong. When the thyroid is underactive, it produces too little of the hormones thyroxine (T4) and triiodothyronine (T3). What are the causes? This condition may be caused by:  Hashimoto's disease. This is a disease in which the body's disease-fighting system (immune system) attacks the thyroid gland. This is the most common cause.  Viral infections.  Pregnancy.  Certain medicines.  Birth defects.  Past radiation treatments to the head or neck for cancer.  Past treatment with radioactive iodine.  Past exposure to radiation in the environment.  Past surgical removal of part or all of the thyroid.  Problems with a gland in the center of the brain (pituitary gland).  Lack of enough iodine in the diet. What increases the risk? You are more likely to develop this condition if:  You are female.  You have a family history of thyroid conditions.  You use a medicine called lithium.  You take medicines that affect the immune system (immunosuppressants). What are the signs or symptoms? Symptoms of this condition include:  Feeling as though you have no energy (lethargy).  Not being able to tolerate cold.  Weight gain that is not explained by  a change in diet or exercise habits.  Lack of appetite.  Dry skin.  Coarse hair.  Menstrual irregularity.  Slowing of thought processes.  Constipation.  Sadness or depression. How is this diagnosed? This condition may be diagnosed based on:  Your symptoms, your medical history, and a physical exam.  Blood tests. You may also have imaging tests, such as an ultrasound or MRI. How is this treated? This condition is treated with medicine that replaces the thyroid hormones that your body does not make. After you begin treatment, it may take several weeks for symptoms to go away. Follow these instructions at home:  Take over-the-counter and prescription medicines only as told by your health care provider.  If you start taking any new medicines, tell your health care provider.  Keep all follow-up visits as told by your health care provider. This is important. ? As your condition improves, your dosage of thyroid hormone medicine may change. ? You will need to have blood tests regularly so that your health care provider can monitor your condition. Contact a health care provider if:  Your symptoms do not get better with treatment.  You are taking thyroid replacement medicine and you: ? Sweat a lot. ? Have tremors. ? Feel anxious. ? Lose weight rapidly. ? Cannot tolerate heat. ? Have emotional swings. ? Have diarrhea. ? Feel weak. Get help right away if you have:  Chest pain.  An irregular heartbeat.  A rapid heartbeat.  Difficulty breathing. Summary  Hypothyroidism is when the thyroid gland does not make enough of certain  hormones (it is underactive).  When the thyroid is underactive, it produces too little of the hormones thyroxine (T4) and triiodothyronine (T3).  The most common cause is Hashimoto's disease, a disease in which the body's disease-fighting system (immune system) attacks the thyroid gland. The condition can also be caused by viral infections,  medicine, pregnancy, or past radiation treatment to the head or neck.  Symptoms may include weight gain, dry skin, constipation, feeling as though you do not have energy, and not being able to tolerate cold.  This condition is treated with medicine to replace the thyroid hormones that your body does not make. This information is not intended to replace advice given to you by your health care provider. Make sure you discuss any questions you have with your health care provider. Document Revised: 10/29/2017 Document Reviewed: 10/27/2017 Elsevier Patient Education  2020 [employer].

## 2020-10-11 LAB — THYROID PEROXIDASE ANTIBODY: Thyroperoxidase Ab SerPl-aCnc: 86 IU/mL — ABNORMAL HIGH (ref <9)

## 2020-10-11 LAB — THYROGLOBULIN ANTIBODY: Thyroglobulin Ab: 2 IU/mL — ABNORMAL HIGH (ref <=1)

## 2020-10-22 ENCOUNTER — Encounter (INDEPENDENT_AMBULATORY_CARE_PROVIDER_SITE_OTHER): Payer: Self-pay | Admitting: *Deleted

## 2020-11-13 ENCOUNTER — Other Ambulatory Visit: Payer: Self-pay

## 2020-11-13 ENCOUNTER — Ambulatory Visit (INDEPENDENT_AMBULATORY_CARE_PROVIDER_SITE_OTHER): Payer: BC Managed Care – PPO | Admitting: Urology

## 2020-11-13 ENCOUNTER — Encounter: Payer: Self-pay | Admitting: Urology

## 2020-11-13 ENCOUNTER — Ambulatory Visit (HOSPITAL_COMMUNITY)
Admission: RE | Admit: 2020-11-13 | Discharge: 2020-11-13 | Disposition: A | Discharge status: Home / Self Care | Payer: BC Managed Care – PPO | Source: Ambulatory Visit | Attending: Urology | Admitting: Urology

## 2020-11-13 VITALS — BP 115/73 | HR 69 | Temp 98.1°F | Ht 64.0 in | Wt 166.0 lb

## 2020-11-13 DIAGNOSIS — N200 Calculus of kidney: Secondary | ICD-10-CM | POA: Diagnosis not present

## 2020-11-13 LAB — MICROSCOPIC EXAMINATION: Renal Epithel, UA: NONE SEEN /hpf

## 2020-11-13 LAB — URINALYSIS, ROUTINE W REFLEX MICROSCOPIC
Bilirubin, UA: NEGATIVE
Glucose, UA: NEGATIVE
Ketones, UA: NEGATIVE
Leukocytes,UA: NEGATIVE
Nitrite, UA: NEGATIVE
Protein,UA: NEGATIVE
Specific Gravity, UA: >1.03 — ABNORMAL HIGH (ref 1.005–1.030)
Urobilinogen, Ur: 0.2 mg/dL (ref 0.2–1.0)
pH, UA: 5.5 (ref 5.0–7.5)

## 2020-11-13 NOTE — Patient Instructions (Signed)
Dietary Guidelines to Help Prevent Kidney Stones Kidney stones are deposits of minerals and salts that form inside your kidneys. Your risk of developing kidney stones may be greater depending on your diet, your lifestyle, the medicines you take, and whether you have certain medical conditions. Most people can reduce their chances of developing kidney stones by following the instructions below. Depending on your overall health and the type of kidney stones you tend to develop, your dietitian may give you more specific instructions. What are tips for following this plan? Reading food labels  Choose foods with "no salt added" or "low-salt" labels. Limit your sodium intake to less than 1500 mg per day.  Choose foods with calcium for each meal and snack. Try to eat about 300 mg of calcium at each meal. Foods that contain 200-500 mg of calcium per serving include: ? 8 oz (237 ml) of milk, fortified nondairy milk, and fortified fruit juice. ? 8 oz (237 ml) of kefir, yogurt, and soy yogurt. ? 4 oz (118 ml) of tofu. ? 1 oz of cheese. ? 1 cup (300 g) of dried figs. ? 1 cup (91 g) of cooked broccoli. ? 1-3 oz can of sardines or mackerel.  Most people need 1000 to 1500 mg of calcium each day. Talk to your dietitian about how much calcium is recommended for you. Shopping  Buy plenty of fresh fruits and vegetables. Most people do not need to avoid fruits and vegetables, even if they contain nutrients that may contribute to kidney stones.  When shopping for convenience foods, choose: ? Whole pieces of fruit. ? Premade salads with dressing on the side. ? Low-fat fruit and yogurt smoothies.  Avoid buying frozen meals or prepared deli foods.  Look for foods with live cultures, such as yogurt and kefir. Cooking  Do not add salt to food when cooking. Place a salt shaker on the table and allow each person to add his or her own salt to taste.  Use vegetable protein, such as beans, textured vegetable  protein (TVP), or tofu instead of meat in pasta, casseroles, and soups. Meal planning   Eat less salt, if told by your dietitian. To do this: ? Avoid eating processed or premade food. ? Avoid eating fast food.  Eat less animal protein, including cheese, meat, poultry, or fish, if told by your dietitian. To do this: ? Limit the number of times you have meat, poultry, fish, or cheese each week. Eat a diet free of meat at least 2 days a week. ? Eat only one serving each day of meat, poultry, fish, or seafood. ? When you prepare animal protein, cut pieces into small portion sizes. For most meat and fish, one serving is about the size of one deck of cards.  Eat at least 5 servings of fresh fruits and vegetables each day. To do this: ? Keep fruits and vegetables on hand for snacks. ? Eat 1 piece of fruit or a handful of berries with breakfast. ? Have a salad and fruit at lunch. ? Have two kinds of vegetables at dinner.  Limit foods that are high in a substance called oxalate. These include: ? Spinach. ? Rhubarb. ? Beets. ? Potato chips and french fries. ? Nuts.  If you regularly take a diuretic medicine, make sure to eat at least 1-2 fruits or vegetables high in potassium each day. These include: ? Avocado. ? Banana. ? Orange, prune, carrot, or tomato juice. ? Baked potato. ? Cabbage. ? Beans and split   peas. General instructions   Drink enough fluid to keep your urine clear or pale yellow. This is the most important thing you can do.  Talk to your health care provider and dietitian about taking daily supplements. Depending on your health and the cause of your kidney stones, you may be advised: ? Not to take supplements with vitamin C. ? To take a calcium supplement. ? To take a daily probiotic supplement. ? To take other supplements such as magnesium, fish oil, or vitamin B6.  Take all medicines and supplements as told by your health care provider.  Limit alcohol intake to no  more than 1 drink a day for nonpregnant women and 2 drinks a day for men. One drink equals 12 oz of beer, 5 oz of wine, or 1 oz of hard liquor.  Lose weight if told by your health care provider. Work with your dietitian to find strategies and an eating plan that works best for you. What foods are not recommended? Limit your intake of the following foods, or as told by your dietitian. Talk to your dietitian about specific foods you should avoid based on the type of kidney stones and your overall health. Grains Breads. Bagels. Rolls. Baked goods. Salted crackers. Cereal. Pasta. Vegetables Spinach. Rhubarb. Beets. Canned vegetables. Pickles. Olives. Meats and other protein foods Nuts. Nut butters. Large portions of meat, poultry, or fish. Salted or cured meats. Deli meats. Hot dogs. Sausages. Dairy Cheese. Beverages Regular soft drinks. Regular vegetable juice. Seasonings and other foods Seasoning blends with salt. Salad dressings. Canned soups. Soy sauce. Ketchup. Barbecue sauce. Canned pasta sauce. Casseroles. Pizza. Lasagna. Frozen meals. Potato chips. French fries. Summary  You can reduce your risk of kidney stones by making changes to your diet.  The most important thing you can do is drink enough fluid. You should drink enough fluid to keep your urine clear or pale yellow.  Ask your health care provider or dietitian how much protein from animal sources you should eat each day, and also how much salt and calcium you should have each day. This information is not intended to replace advice given to you by your health care provider. Make sure you discuss any questions you have with your health care provider. Document Revised: 03/08/2019 Document Reviewed: 10/27/2016 Elsevier Patient Education  2020 Elsevier Inc.  

## 2020-11-13 NOTE — Progress Notes (Signed)
11/13/2020 10:28 AM   Lindwood Qua 23-Jun-1971 [id number]  Referring provider: Maximiano Coss, MD [facility] [address] [place],  [place] [address]  nephrolithiasis  HPI: Ms Sabrina [name] is a [phone] here for evaluation of nephrolithiasis. She has been having intermittent left flank pain for several months. She underwent abdominal US on 08/01/2020 which showed bilateral renal calculi and a probably 58mm left UPJ calculus. She has a hx of ESWL in 12/2015. She has associated nausea with the left flank pain. No significant LUTS. Brother and uncle have a hx of nephrolithiasis   PMH: Past Medical History:  Diagnosis Date   Anxiety    GERD (gastroesophageal reflux disease)    History of hiatal hernia    History of kidney stones    Hypothyroidism    Hypothyroidism    PONV (postoperative nausea and vomiting)     Surgical History: Past Surgical History:  Procedure Laterality Date   CESAREAN SECTION  2011   CHOLECYSTECTOMY N/A 09/13/2020   Procedure: LAPAROSCOPIC CHOLECYSTECTOMY;  Surgeon: Franky Macho, MD;  Location: [facility];  Service: General;  Laterality: N/A;   HERNIA REPAIR  2015   LASIK  2019   LITHOTRIPSY  2017   NASAL SINUS SURGERY     x2   TUBAL LIGATION      Home Medications:  Allergies as of 11/13/2020      Reactions   Erythromycin Nausea And Vomiting   Nitrofurantoin Palpitations      Medication List       Accurate as of November 13, 2020 10:28 AM. If you have any questions, ask your nurse or doctor.        STOP taking these medications   OMEPRAZOLE PO Stopped by: Wilkie Aye, MD   zinc gluconate 50 MG tablet Stopped by: Wilkie Aye, MD     TAKE these medications   busPIRone 7.5 MG tablet Commonly known as: BUSPAR Take 7.5 mg by mouth 2 (two) times daily.   busPIRone 10 MG tablet Commonly known as: BUSPAR Take 10 mg by mouth 2 (two) times daily.   cholecalciferol 25 MCG (1000 UNIT)  tablet Commonly known as: VITAMIN D3 Take 1,000 Units by mouth daily.   eletriptan 20 MG tablet Commonly known as: RELPAX Take 20 mg by mouth as needed for migraine or headache. May repeat in 2 hours if headache persists or recurs.   FLUoxetine 10 MG capsule Commonly known as: PROZAC Take 10 mg by mouth daily.   levocetirizine 5 MG tablet Commonly known as: XYZAL Take 5 mg by mouth every evening.   levothyroxine 100 MCG tablet Commonly known as: SYNTHROID Take 100 mcg by mouth daily before breakfast.   pantoprazole 40 MG tablet Commonly known as: PROTONIX Take 40 mg by mouth daily.   vitamin C 1000 MG tablet Take 1,000 mg by mouth daily.       Allergies:  Allergies  Allergen Reactions   Erythromycin Nausea And Vomiting   Nitrofurantoin Palpitations    Family History: Family History  Problem Relation Age of Onset   Cancer Mother    Heart disease Father    Diabetes Maternal Grandfather    Thyroid disease Paternal Grandmother     Social History:  reports that she has never smoked. She has never used smokeless tobacco. She reports that she does not drink alcohol and does not use drugs.  ROS: All other review of systems were reviewed and are negative except what is noted above in  HPI  Physical Exam: BP 115/73    Pulse 69    Temp 98.1 F (36.7 C)    Ht 5\' 4"  (1.626 m)    Wt 166 lb (75.3 kg)    BMI 28.49 kg/m   Constitutional:  Alert and oriented, No acute distress. HEENT: [place] AT, moist mucus membranes.  Trachea midline, no masses. Cardiovascular: No clubbing, cyanosis, or edema. Respiratory: Normal respiratory effort, no increased work of breathing. GI: Abdomen is soft, nontender, nondistended, no abdominal masses GU: No CVA tenderness.  Lymph: No cervical or inguinal lymphadenopathy. Skin: No rashes, bruises or suspicious lesions. Neurologic: Grossly intact, no focal deficits, moving all 4 extremities. Psychiatric: Normal mood and affect.  Laboratory  Data: Lab Results  Component Value Date   WBC 8.9 08/19/2010   HGB 9.5 (L) 08/19/2010   HCT 28.2 (L) 08/19/2010   MCV 87.5 08/19/2010   PLT 165 08/19/2010    Lab Results  Component Value Date   CREATININE 0.61 08/13/2010    No results found for: PSA  No results found for: TESTOSTERONE  No results found for: HGBA1C  Urinalysis    Component Value Date/Time   COLORURINE YELLOW 08/13/2010 1617   APPEARANCEUR CLEAR 08/13/2010 1617   LABSPEC 1.015 08/13/2010 1617   PHURINE 7.5 08/13/2010 1617   GLUCOSEU NEGATIVE 08/13/2010 1617   HGBUR NEGATIVE 08/13/2010 1617   BILIRUBINUR NEGATIVE 08/13/2010 1617   KETONESUR NEGATIVE 08/13/2010 1617   PROTEINUR NEGATIVE 08/13/2010 1617   UROBILINOGEN 0.2 08/13/2010 1617   NITRITE NEGATIVE 08/13/2010 1617   LEUKOCYTESUR SMALL (A) 08/13/2010 1617    Lab Results  Component Value Date   BACTERIA RARE 08/13/2010    Pertinent Imaging: KUB today: Images reviewed and discussed with the patient No results found for this or any previous visit.  No results found for this or any previous visit.  No results found for this or any previous visit.  No results found for this or any previous visit.  No results found for this or any previous visit.  No results found for this or any previous visit.  No results found for this or any previous visit.  No results found for this or any previous visit.   Assessment & Plan:    1. Kidney stones -We discussed the management of kidney stones. These options include observation, ureteroscopy, shockwave lithotripsy (ESWL) and percutaneous nephrolithotomy (PCNL). We discussed which options are relevant to the patient's stone(s). We discussed the natural history of kidney stones as well as the complications of untreated stones and the impact on quality of life without treatment as well as with each of the above listed treatments. We also discussed the efficacy of each treatment in its ability to clear the  stone burden. With any of these management options I discussed the signs and symptoms of infection and the need for emergent treatment should these be experienced. For each option we discussed the ability of each procedure to clear the patient of their stone burden.   For observation I described the risks which include but are not limited to silent renal damage, life-threatening infection, need for emergent surgery, failure to pass stone and pain.   For ureteroscopy I described the risks which include bleeding, infection, damage to contiguous structures, positioning injury, ureteral stricture, ureteral avulsion, ureteral injury, need for prolonged ureteral stent, inability to perform ureteroscopy, need for an interval procedure, inability to clear stone burden, stent discomfort/pain, heart attack, stroke, pulmonary embolus and the inherent risks with general anesthesia.  For shockwave lithotripsy I described the risks which include arrhythmia, kidney contusion, kidney hemorrhage, need for transfusion, pain, inability to adequately break up stone, inability to pass stone fragments, Steinstrasse, infection associated with obstructing stones, need for alternate surgical procedure, need for repeat shockwave lithotripsy, MI, CVA, PE and the inherent risks with anesthesia/conscious sedation.   For PCNL I described the risks including positioning injury, pneumothorax, hydrothorax, need for chest tube, inability to clear stone burden, renal laceration, arterial venous fistula or malformation, need for embolization of kidney, loss of kidney or renal function, need for repeat procedure, need for prolonged nephrostomy tube, ureteral avulsion, MI, CVA, PE and the inherent risks of general anesthesia.   - The patient would like to proceed with observation. RTC 3 months with renal US and KUB  - Urinalysis, Routine w reflex microscopic   No follow-ups on file.  Wilkie Aye, MD  [facility]  [place]

## 2020-11-13 NOTE — Progress Notes (Signed)
Urological Symptom Review  Patient is experiencing the following symptoms: Kidney stones   Review of Systems  Gastrointestinal (upper)  : Indigestion/heartburn  Gastrointestinal (lower) : Negative for lower GI symptoms  Constitutional : Negative for symptoms  Skin: Negative for skin symptoms  Eyes: Negative for eye symptoms  Ear/Nose/Throat : Negative for Ear/Nose/Throat symptoms  Hematologic/Lymphatic: Negative for Hematologic/Lymphatic symptoms  Cardiovascular : Negative for cardiovascular symptoms  Respiratory : Negative for respiratory symptoms  Endocrine: Negative for endocrine symptoms  Musculoskeletal: Negative for musculoskeletal symptoms  Neurological: Headaches  Psychologic: Anxiety

## 2020-11-27 NOTE — Progress Notes (Signed)
Sent via mychart

## 2020-12-17 ENCOUNTER — Encounter: Payer: Self-pay | Admitting: Urology

## 2021-01-23 ENCOUNTER — Ambulatory Visit (INDEPENDENT_AMBULATORY_CARE_PROVIDER_SITE_OTHER): Payer: BC Managed Care – PPO | Admitting: Gastroenterology

## 2021-02-14 ENCOUNTER — Ambulatory Visit: Payer: BC Managed Care – PPO | Admitting: Urology

## 2021-02-18 ENCOUNTER — Ambulatory Visit (INDEPENDENT_AMBULATORY_CARE_PROVIDER_SITE_OTHER): Payer: BC Managed Care – PPO | Admitting: Internal Medicine

## 2021-02-18 ENCOUNTER — Encounter (INDEPENDENT_AMBULATORY_CARE_PROVIDER_SITE_OTHER): Payer: Self-pay

## 2021-02-18 ENCOUNTER — Encounter (INDEPENDENT_AMBULATORY_CARE_PROVIDER_SITE_OTHER): Payer: Self-pay | Admitting: Internal Medicine

## 2021-02-18 ENCOUNTER — Other Ambulatory Visit: Payer: Self-pay

## 2021-02-18 ENCOUNTER — Other Ambulatory Visit (INDEPENDENT_AMBULATORY_CARE_PROVIDER_SITE_OTHER): Payer: Self-pay

## 2021-02-18 DIAGNOSIS — K219 Gastro-esophageal reflux disease without esophagitis: Secondary | ICD-10-CM | POA: Diagnosis not present

## 2021-02-18 DIAGNOSIS — K5900 Constipation, unspecified: Secondary | ICD-10-CM

## 2021-02-18 MED ORDER — DOCUSATE SODIUM 100 MG PO CAPS
200.0000 mg | ORAL_CAPSULE | Freq: Every day | ORAL | Status: DC
Start: 2021-02-18 — End: 2021-04-15

## 2021-02-18 MED ORDER — DEXLANSOPRAZOLE 60 MG PO CPDR: 60.0000 mg | DELAYED_RELEASE_CAPSULE | Freq: Every day | ORAL | 5 refills | Status: DC

## 2021-02-18 NOTE — Patient Instructions (Signed)
Esophagogastroduodenoscopy to be scheduled. Add walnuts and almonds to your diet and eat apple with skin every day.

## 2021-02-18 NOTE — Progress Notes (Unsigned)
Presenting complaint;  Chest pain and throat fullness  History of present illness  Patient is 50 year old Caucasian female who is referred through courtesy of Ms. Leretha Pol, NP/Dr. Linna Darner, MD for GI evaluation. Patient says she has had symptoms of GERD for at least 20 years.  She had EGD about 20 years ago by Dr. Sammie Bench of [facility] and was a normal exam.  She has been on PPI off and on.  She previously has been on omeprazole but for the last 7 months or so she has been on pantoprazole. About 6 months ago she began to experience chest and upper abdominal pain.  Ultrasound was negative for gallstones or wall thickening.  However HIDA scan was abnormal.  She was referred to Dr. Franky Macho and underwent laparoscopic cholecystectomy on 09/13/2020.  She is not having any upper abdominal pain.  She says frequency and severity of chest pain has improved but not gone away completely.  She also notices fullness or lump in her throat.  Ms. Leretha Pol, NP felt that her persistent symptoms are due to GERD and she may have developed hiatal hernia and therefore requested evaluation. The symptoms occur at least once a week.  She describes her pain to be mild and and pressure sensation radiating into intrascapular region.  At times she feels as if something is expanding.  This pain may last for 20 to 30 minutes.  It is not associated with nausea vomiting shortness of breath or diaphoresis.  She says she does not experience heartburn since she has been on pantoprazole.  She states her hoarseness is improved since she has been on pantoprazole.  She is watching her diet.  She denies nocturnal chest pain or regurgitation.  Her appetite is good.  She may have gained few pounds over the last 1 year.  She also complains of bloating and and constipation.  She denies melena or rectal bleeding.   Current Medications: Outpatient Encounter Medications as of 02/18/2021  Medication Sig  .  Ascorbic Acid (VITAMIN C) 1000 MG tablet Take 1,000 mg by mouth daily.  . busPIRone (BUSPAR) 7.5 MG tablet Take 7.5 mg by mouth 2 (two) times daily.   Marland Kitchen docusate sodium (COLACE) 100 MG capsule Take 100 mg by mouth at bedtime.  Marland Kitchen eletriptan (RELPAX) 20 MG tablet Take 20 mg by mouth as needed for migraine or headache. May repeat in 2 hours if headache persists or recurs.  Marland Kitchen FLUoxetine (PROZAC) 10 MG capsule Take 10 mg by mouth daily.  Marland Kitchen levocetirizine (XYZAL) 5 MG tablet Take 5 mg by mouth every evening.  Marland Kitchen levothyroxine (SYNTHROID) 100 MCG tablet Take 100 mcg by mouth daily before breakfast.   . pantoprazole (PROTONIX) 40 MG tablet Take 40 mg by mouth daily.   . Selenium 200 MCG CAPS Take by mouth daily.  . [DISCONTINUED] busPIRone (BUSPAR) 10 MG tablet Take 10 mg by mouth 2 (two) times daily.  . cholecalciferol (VITAMIN D3) 25 MCG (1000 UNIT) tablet Take 1,000 Units by mouth daily.    Past medical history  GERD of 20 years duration.  Normal EGD 20 years ago Hypothyroidism for about 10 years Anxiety disorder. C-section in 2011 along with tubal ligation Normal screening colonoscopy by Dr. Darcus Austin in 2016 History of urolithiasis.  Lithotripsy in 2017.   Umbilical herniorrhaphy 2016 Sinus surgery and 1996 and redo in 2000. 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  Allergies  Allergies  Allergen Reactions  . Erythromycin Nausea And Vomiting  .  Nitrofurantoin Palpitations    Family history  Father is 75 years old.  He has coronary artery disease and has had coronary stent or stents placed. Mother was treated for breast carcinoma at age 73 and in remission at age 76. She has a brother age 52 in good health. 204 great aunts on father's side had thyroid disease.  Social history  Patient is married.  They have 2 sons ages 15 and 10 in good health.  She has never smoked cigarettes and does not drink alcohol.  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   Physical examination  Blood pressure 119/75, pulse 69, temperature 99.3 F (37.4 C), temperature source Oral, height 5' 4" (1.626 m), weight 165 lb 1.6 oz (74.9 kg). Patient is alert and in no acute distress. Patient is wearing a mask. Conjunctiva is pink. Sclera is nonicteric Oropharyngeal mucosa is normal. No neck masses or thyromegaly noted. Cardiac exam with regular rhythm normal S1 and S2. No murmur or gallop noted. Lungs are clear to auscultation. Abdomen.  Pfannenstiel scar.  Bowel sounds are normal.  On palpation abdomen is soft and nontender with organomegaly or masses. No LE edema or clubbing noted.  Labs/studies Results:  Free T4 1.19 on 10/10/2020. TSH 0.87 on 10/10/2020.  Lab data from 07/04/2020 WBC 5.9, H&H 12.8 and 37.9 and platelet count 275K Glucose 67 BUN 11 and creatinine 0.69 Serum sodium 141, potassium 5.0, chloride 106, CO2 24 Serum calcium 8.9 Bilirubin 0.4, AP 57, AST 10, ALT 9, total protein 6.4 and albumin 4.1.   Assessment:  #1.  Patient symptoms of chest pain and fullness in throat are most likely due to gastroesophageal reflux disease which is not well controlled with current PPI.  Therefore endoscopic evaluation of upper GI tract would be appropriate.  In the meantime she will be switched to a different PPI and see if symptom control will improve.  #2.  Constipation.  Patient does not have any alarm symptoms.  She is up-to-date on CRC screening.  Next colonoscopy not due until 2026.  Recommendations  Antireflux measures reinforced. Discontinue pantoprazole. Dexlansoprazole 60 mg by mouth 30 minutes before breakfast daily. Increase dietary fiber as tolerated. Colace 200 mg p.o. nightly. Diagnostic esophagogastroduodenoscopy to be scheduled in near future.       

## 2021-02-18 NOTE — H&P (View-Only) (Signed)
Presenting complaint;  Chest pain and throat fullness  History of present illness  Patient is 50 year old Caucasian female who is referred through courtesy of Ms. Leretha Pol, NP/Dr. Linna Darner, MD for GI evaluation. Patient says she has had symptoms of GERD for at least 20 years.  She had EGD about 20 years ago by Dr. Sammie Bench of [facility] and was a normal exam.  She has been on PPI off and on.  She previously has been on omeprazole but for the last 7 months or so she has been on pantoprazole. About 6 months ago she began to experience chest and upper abdominal pain.  Ultrasound was negative for gallstones or wall thickening.  However HIDA scan was abnormal.  She was referred to Dr. Franky Macho and underwent laparoscopic cholecystectomy on 09/13/2020.  She is not having any upper abdominal pain.  She says frequency and severity of chest pain has improved but not gone away completely.  She also notices fullness or lump in her throat.  Ms. Leretha Pol, NP felt that her persistent symptoms are due to GERD and she may have developed hiatal hernia and therefore requested evaluation. The symptoms occur at least once a week.  She describes her pain to be mild and and pressure sensation radiating into intrascapular region.  At times she feels as if something is expanding.  This pain may last for 20 to 30 minutes.  It is not associated with nausea vomiting shortness of breath or diaphoresis.  She says she does not experience heartburn since she has been on pantoprazole.  She states her hoarseness is improved since she has been on pantoprazole.  She is watching her diet.  She denies nocturnal chest pain or regurgitation.  Her appetite is good.  She may have gained few pounds over the last 1 year.  She also complains of bloating and and constipation.  She denies melena or rectal bleeding.   Current Medications: Outpatient Encounter Medications as of 02/18/2021  Medication Sig  .  Ascorbic Acid (VITAMIN C) 1000 MG tablet Take 1,000 mg by mouth daily.  . busPIRone (BUSPAR) 7.5 MG tablet Take 7.5 mg by mouth 2 (two) times daily.   Marland Kitchen docusate sodium (COLACE) 100 MG capsule Take 100 mg by mouth at bedtime.  Marland Kitchen eletriptan (RELPAX) 20 MG tablet Take 20 mg by mouth as needed for migraine or headache. May repeat in 2 hours if headache persists or recurs.  Marland Kitchen FLUoxetine (PROZAC) 10 MG capsule Take 10 mg by mouth daily.  Marland Kitchen levocetirizine (XYZAL) 5 MG tablet Take 5 mg by mouth every evening.  Marland Kitchen levothyroxine (SYNTHROID) 100 MCG tablet Take 100 mcg by mouth daily before breakfast.   . pantoprazole (PROTONIX) 40 MG tablet Take 40 mg by mouth daily.   . Selenium 200 MCG CAPS Take by mouth daily.  . [DISCONTINUED] busPIRone (BUSPAR) 10 MG tablet Take 10 mg by mouth 2 (two) times daily.  . cholecalciferol (VITAMIN D3) 25 MCG (1000 UNIT) tablet Take 1,000 Units by mouth daily.    Past medical history  GERD of 20 years duration.  Normal EGD 20 years ago Hypothyroidism for about 10 years Anxiety disorder. C-section in 2011 along with tubal ligation Normal screening colonoscopy by Dr. Darcus Austin in 2016 History of urolithiasis.  Lithotripsy in 2017.   Umbilical herniorrhaphy 2016 Sinus surgery and 1996 and redo in 2000. 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  Allergies  Allergies  Allergen Reactions  . Erythromycin Nausea And Vomiting  .  Nitrofurantoin Palpitations    Family history  Father is 53 years old.  He has coronary artery disease and has had coronary stent or stents placed. Mother was treated for breast carcinoma at age 40 and in remission at age 16. She has a brother age 62 in good health. 204 great aunts on father's side had thyroid disease.  Social history  Patient is married.  They have 2 sons ages 20 and 83 in good health.  She has never smoked cigarettes and does not drink alcohol.  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   Physical examination  Blood pressure 119/75, pulse 69, temperature 99.3 F (37.4 C), temperature source Oral, height 5\' 4"  (1.626 m), weight 165 lb 1.6 oz (74.9 kg). Patient is alert and in no acute distress. Patient is wearing a mask. Conjunctiva is pink. Sclera is nonicteric Oropharyngeal mucosa is normal. No neck masses or thyromegaly noted. Cardiac exam with regular rhythm normal S1 and S2. No murmur or gallop noted. Lungs are clear to auscultation. Abdomen.  Pfannenstiel scar.  Bowel sounds are normal.  On palpation abdomen is soft and nontender with organomegaly or masses. No LE edema or clubbing noted.  Labs/studies Results:  Free T4 1.19 on 10/10/2020. TSH 0.87 on 10/10/2020.  Lab data from 07/04/2020 WBC 5.9, H&H 12.8 and 37.9 and platelet count 275K Glucose 67 BUN 11 and creatinine 0.69 Serum sodium 141, potassium 5.0, chloride 106, CO2 24 Serum calcium 8.9 Bilirubin 0.4, AP 57, AST 10, ALT 9, total protein 6.4 and albumin 4.1.   Assessment:  #1.  Patient symptoms of chest pain and fullness in throat are most likely due to gastroesophageal reflux disease which is not well controlled with current PPI.  Therefore endoscopic evaluation of upper GI tract would be appropriate.  In the meantime she will be switched to a different PPI and see if symptom control will improve.  #2.  Constipation.  Patient does not have any alarm symptoms.  She is up-to-date on CRC screening.  Next colonoscopy not due until 2026.  Recommendations  Antireflux measures reinforced. Discontinue pantoprazole. Dexlansoprazole 60 mg by mouth 30 minutes before breakfast daily. Increase dietary fiber as tolerated. Colace 200 mg p.o. nightly. Diagnostic esophagogastroduodenoscopy to be scheduled in near future.

## 2021-02-20 ENCOUNTER — Telehealth (INDEPENDENT_AMBULATORY_CARE_PROVIDER_SITE_OTHER): Payer: Self-pay | Admitting: Internal Medicine

## 2021-02-20 ENCOUNTER — Other Ambulatory Visit (INDEPENDENT_AMBULATORY_CARE_PROVIDER_SITE_OTHER): Payer: Self-pay

## 2021-02-20 MED ORDER — DEXLANSOPRAZOLE 60 MG PO CPDR: 60.0000 mg | DELAYED_RELEASE_CAPSULE | Freq: Every day | ORAL | 5 refills | Status: DC

## 2021-02-20 NOTE — Telephone Encounter (Signed)
Patient left message stating Dr Karilyn Cota was to send in a prescription for Dexilant to [facility] in [place] - stated she called the pharmacy and they said they did not receive - please advise - ph# [phone]

## 2021-02-20 NOTE — Telephone Encounter (Signed)
I called and left a message that Dr. Karilyn Cota did send in the mediation for her on 02/18/2021. But I did resubmit it to [facility].

## 2021-02-21 ENCOUNTER — Encounter (INDEPENDENT_AMBULATORY_CARE_PROVIDER_SITE_OTHER): Payer: Self-pay

## 2021-02-28 NOTE — Patient Instructions (Signed)
Sabrina [name]  02/28/2021     @PREFPERIOPPHARMACY @   Your procedure is scheduled on  03/05/2021.   Report to 05/05/2021 at  0700  A.M.   Call this number if you have problems the morning of surgery:  [phone]   Remember:  Follow the diet instructions given to you by the office.                     Take these medicines the morning of surgery with A SIP OF WATER buspar, dexilant, relpax (if needed), prozac, levothyroxine.     Please brush your teeth.  Do not wear jewelry, make-up or nail polish.  Do not wear lotions, powders, or perfumes, or deodorant.  Do not shave 48 hours prior to surgery.  Men may shave face and neck.  Do not bring valuables to the hospital.  [facility] is not responsible for any belongings or valuables.  Contacts, dentures or bridgework may not be worn into surgery.  Leave your suitcase in the car.  After surgery it may be brought to your room.  For patients admitted to the hospital, discharge time will be determined by your treatment team.  Patients discharged the day of surgery will not be allowed to drive home and must have someone with them for 24 hours.     Special instructions:  DO NOT smoke tobacco or vape the morning of your procedure.  Please read over the following fact sheets that you were given. Anesthesia Post-op Instructions and Care and Recovery After Surgery       Upper Endoscopy, Adult, Care After This sheet gives you information about how to care for yourself after your procedure. Your health care provider may also give you more specific instructions. If you have problems or questions, contact your health care provider. What can I expect after the procedure? After the procedure, it is common to have:  A sore throat.  Mild stomach pain or discomfort.  Bloating.  Nausea. Follow these instructions at home:  Follow instructions from your health care provider about what to eat or drink after your  procedure.  Return to your normal activities as told by your health care provider. Ask your health care provider what activities are safe for you.  Take over-the-counter and prescription medicines only as told by your health care provider.  If you were given a sedative during the procedure, it can affect you for several hours. Do not drive or operate machinery until your health care provider says that it is safe.  Keep all follow-up visits as told by your health care provider. This is important.   Contact a health care provider if you have:  A sore throat that lasts longer than one day.  Trouble swallowing. Get help right away if:  You vomit blood or your vomit looks like coffee grounds.  You have: ? A fever. ? Bloody, black, or tarry stools. ? A severe sore throat or you cannot swallow. ? Difficulty breathing. ? Severe pain in your chest or abdomen. Summary  After the procedure, it is common to have a sore throat, mild stomach discomfort, bloating, and nausea.  If you were given a sedative during the procedure, it can affect you for several hours. Do not drive or operate machinery until your health care provider says that it is safe.  Follow instructions from your health care provider about what to eat or drink after your procedure.  Return to  your normal activities as told by your health care provider. This information is not intended to replace advice given to you by your health care provider. Make sure you discuss any questions you have with your health care provider. Document Revised: 11/14/2019 Document Reviewed: 04/18/2018 Elsevier Patient Education  2021 [employer] After This sheet gives you information about how to care for yourself after your procedure. Your health care provider may also give you more specific instructions. If you have problems or questions, contact your health care provider. What can I expect after the  procedure? After the procedure, it is common to have:  Tiredness.  Forgetfulness about what happened after the procedure.  Impaired judgment for important decisions.  Nausea or vomiting.  Some difficulty with balance. Follow these instructions at home: For the time period you were told by your health care provider:  Rest as needed.  Do not participate in activities where you could fall or become injured.  Do not drive or use machinery.  Do not drink alcohol.  Do not take sleeping pills or medicines that cause drowsiness.  Do not make important decisions or sign legal documents.  Do not take care of children on your own.      Eating and drinking  Follow the diet that is recommended by your health care provider.  Drink enough fluid to keep your urine pale yellow.  If you vomit: ? Drink water, juice, or soup when you can drink without vomiting. ? Make sure you have little or no nausea before eating solid foods. General instructions  Have a responsible adult stay with you for the time you are told. It is important to have someone help care for you until you are awake and alert.  Take over-the-counter and prescription medicines only as told by your health care provider.  If you have sleep apnea, surgery and certain medicines can increase your risk for breathing problems. Follow instructions from your health care provider about wearing your sleep device: ? Anytime you are sleeping, including during daytime naps. ? While taking prescription pain medicines, sleeping medicines, or medicines that make you drowsy.  Avoid smoking.  Keep all follow-up visits as told by your health care provider. This is important. Contact a health care provider if:  You keep feeling nauseous or you keep vomiting.  You feel light-headed.  You are still sleepy or having trouble with balance after 24 hours.  You develop a rash.  You have a fever.  You have redness or swelling around  the IV site. Get help right away if:  You have trouble breathing.  You have new-onset confusion at home. Summary  For several hours after your procedure, you may feel tired. You may also be forgetful and have poor judgment.  Have a responsible adult stay with you for the time you are told. It is important to have someone help care for you until you are awake and alert.  Rest as told. Do not drive or operate machinery. Do not drink alcohol or take sleeping pills.  Get help right away if you have trouble breathing, or if you suddenly become confused. This information is not intended to replace advice given to you by your health care provider. Make sure you discuss any questions you have with your health care provider. Document Revised: 08/01/2020 Document Reviewed: 10/19/2019 Elsevier Patient Education  2021 [employer].

## 2021-03-03 ENCOUNTER — Encounter (HOSPITAL_COMMUNITY): Payer: Self-pay

## 2021-03-03 ENCOUNTER — Other Ambulatory Visit (HOSPITAL_COMMUNITY)
Admission: RE | Admit: 2021-03-03 | Discharge: 2021-03-03 | Disposition: A | Discharge status: Home / Self Care | Payer: BC Managed Care – PPO | Source: Ambulatory Visit | Attending: Internal Medicine | Admitting: Internal Medicine

## 2021-03-03 ENCOUNTER — Other Ambulatory Visit: Payer: Self-pay

## 2021-03-03 ENCOUNTER — Encounter (HOSPITAL_COMMUNITY)
Admission: RE | Admit: 2021-03-03 | Discharge: 2021-03-03 | Disposition: A | Discharge status: Home / Self Care | Payer: BC Managed Care – PPO | Source: Ambulatory Visit | Attending: Internal Medicine | Admitting: Internal Medicine

## 2021-03-03 DIAGNOSIS — K449 Diaphragmatic hernia without obstruction or gangrene: Secondary | ICD-10-CM | POA: Diagnosis not present

## 2021-03-03 DIAGNOSIS — K5900 Constipation, unspecified: Secondary | ICD-10-CM | POA: Diagnosis not present

## 2021-03-03 DIAGNOSIS — K219 Gastro-esophageal reflux disease without esophagitis: Secondary | ICD-10-CM

## 2021-03-03 DIAGNOSIS — Z79890 Hormone replacement therapy (postmenopausal): Secondary | ICD-10-CM | POA: Diagnosis not present

## 2021-03-03 DIAGNOSIS — Z20822 Contact with and (suspected) exposure to covid-19: Secondary | ICD-10-CM | POA: Insufficient documentation

## 2021-03-03 DIAGNOSIS — Z881 Allergy status to other antibiotic agents status: Secondary | ICD-10-CM | POA: Diagnosis not present

## 2021-03-03 DIAGNOSIS — Z01812 Encounter for preprocedural laboratory examination: Secondary | ICD-10-CM | POA: Insufficient documentation

## 2021-03-03 DIAGNOSIS — Z9049 Acquired absence of other specified parts of digestive tract: Secondary | ICD-10-CM | POA: Diagnosis not present

## 2021-03-03 DIAGNOSIS — Z8249 Family history of ischemic heart disease and other diseases of the circulatory system: Secondary | ICD-10-CM | POA: Diagnosis not present

## 2021-03-03 DIAGNOSIS — Z79899 Other long term (current) drug therapy: Secondary | ICD-10-CM | POA: Diagnosis not present

## 2021-03-03 DIAGNOSIS — K2100 Gastro-esophageal reflux disease with esophagitis, without bleeding: Secondary | ICD-10-CM | POA: Diagnosis not present

## 2021-03-03 DIAGNOSIS — K317 Polyp of stomach and duodenum: Secondary | ICD-10-CM | POA: Diagnosis not present

## 2021-03-03 LAB — PREGNANCY, URINE: Preg Test, Ur: NEGATIVE

## 2021-03-04 LAB — SARS CORONAVIRUS 2 (TAT 6-24 HRS): SARS Coronavirus 2: NEGATIVE

## 2021-03-05 ENCOUNTER — Encounter (HOSPITAL_COMMUNITY)
Admission: RE | Disposition: A | Discharge status: Home / Self Care | Payer: Self-pay | Source: Ambulatory Visit | Attending: Internal Medicine

## 2021-03-05 ENCOUNTER — Ambulatory Visit (HOSPITAL_COMMUNITY): Payer: BC Managed Care – PPO | Admitting: Anesthesiology

## 2021-03-05 ENCOUNTER — Encounter (HOSPITAL_COMMUNITY): Payer: Self-pay | Admitting: Internal Medicine

## 2021-03-05 ENCOUNTER — Ambulatory Visit (HOSPITAL_COMMUNITY)
Admission: RE | Admit: 2021-03-05 | Discharge: 2021-03-05 | Disposition: A | Discharge status: Home / Self Care | Payer: BC Managed Care – PPO | Source: Ambulatory Visit | Attending: Internal Medicine | Admitting: Internal Medicine

## 2021-03-05 DIAGNOSIS — Z8249 Family history of ischemic heart disease and other diseases of the circulatory system: Secondary | ICD-10-CM | POA: Insufficient documentation

## 2021-03-05 DIAGNOSIS — Z9049 Acquired absence of other specified parts of digestive tract: Secondary | ICD-10-CM | POA: Insufficient documentation

## 2021-03-05 DIAGNOSIS — K219 Gastro-esophageal reflux disease without esophagitis: Secondary | ICD-10-CM

## 2021-03-05 DIAGNOSIS — Z20822 Contact with and (suspected) exposure to covid-19: Secondary | ICD-10-CM | POA: Insufficient documentation

## 2021-03-05 DIAGNOSIS — K317 Polyp of stomach and duodenum: Secondary | ICD-10-CM

## 2021-03-05 DIAGNOSIS — K5900 Constipation, unspecified: Secondary | ICD-10-CM | POA: Insufficient documentation

## 2021-03-05 DIAGNOSIS — Z79899 Other long term (current) drug therapy: Secondary | ICD-10-CM | POA: Insufficient documentation

## 2021-03-05 DIAGNOSIS — K449 Diaphragmatic hernia without obstruction or gangrene: Secondary | ICD-10-CM

## 2021-03-05 DIAGNOSIS — Z881 Allergy status to other antibiotic agents status: Secondary | ICD-10-CM | POA: Insufficient documentation

## 2021-03-05 DIAGNOSIS — K2100 Gastro-esophageal reflux disease with esophagitis, without bleeding: Secondary | ICD-10-CM | POA: Diagnosis not present

## 2021-03-05 DIAGNOSIS — Z79890 Hormone replacement therapy (postmenopausal): Secondary | ICD-10-CM | POA: Insufficient documentation

## 2021-03-05 HISTORY — PX: BIOPSY: SHX5522

## 2021-03-05 HISTORY — PX: ESOPHAGOGASTRODUODENOSCOPY (EGD) WITH PROPOFOL: SHX5813

## 2021-03-05 SURGERY — ESOPHAGOGASTRODUODENOSCOPY (EGD) WITH PROPOFOL
Anesthesia: General

## 2021-03-05 MED ORDER — STERILE WATER FOR IRRIGATION IR SOLN
Status: DC | PRN
  Administered 2021-03-05: 100 mL

## 2021-03-05 MED ORDER — CHLORHEXIDINE GLUCONATE CLOTH 2 % EX PADS: 6.0000 | MEDICATED_PAD | Freq: Once | CUTANEOUS | Status: DC

## 2021-03-05 MED ORDER — LACTATED RINGERS IV SOLN: INTRAVENOUS | Status: DC

## 2021-03-05 MED ORDER — PROPOFOL 500 MG/50ML IV EMUL
INTRAVENOUS | Status: DC | PRN
  Administered 2021-03-05: 150 ug/kg/min via INTRAVENOUS

## 2021-03-05 MED ORDER — PROPOFOL 10 MG/ML IV BOLUS
INTRAVENOUS | Status: AC
  Filled 2021-03-05: qty 40

## 2021-03-05 MED ORDER — PROPOFOL 10 MG/ML IV BOLUS
INTRAVENOUS | Status: DC | PRN
  Administered 2021-03-05: 40 mg via INTRAVENOUS
  Administered 2021-03-05 (×2): 20 mg via INTRAVENOUS
  Administered 2021-03-05: 10 mg via INTRAVENOUS
  Administered 2021-03-05: 20 mg via INTRAVENOUS
  Administered 2021-03-05: 10 mg via INTRAVENOUS
  Administered 2021-03-05: 20 mg via INTRAVENOUS
  Administered 2021-03-05: 10 mg via INTRAVENOUS

## 2021-03-05 NOTE — Discharge Instructions (Signed)
No aspirin or NSAIDs for 24 hours Resume usual medications and diet as before No driving for 24 hours Physician will call with biopsy results.     Upper Endoscopy, Adult Upper endoscopy is a procedure to look inside the upper GI (gastrointestinal) tract. The upper GI tract is made up of:  The part of the body that moves food from your mouth to your stomach (esophagus).  The stomach.  The first part of your small intestine (duodenum). This procedure is also called esophagogastroduodenoscopy (EGD) or gastroscopy. In this procedure, your health care provider passes a thin, flexible tube (endoscope) through your mouth and down your esophagus into your stomach. A small camera is attached to the end of the tube. Images from the camera appear on a monitor in the exam room. During this procedure, your health care provider may also remove a small piece of tissue to be sent to a lab and examined under a microscope (biopsy). Your health care provider may do an upper endoscopy to diagnose cancers of the upper GI tract. You may also have this procedure to find the cause of other conditions, such as:  Stomach pain.  Heartburn.  Pain or problems when swallowing.  Nausea and vomiting.  Stomach bleeding.  Stomach ulcers. Tell a health care provider about:  Any allergies you have.  All medicines you are taking, including vitamins, herbs, eye drops, creams, and over-the-counter medicines.  Any problems you or family members have had with anesthetic medicines.  Any blood disorders you have.  Any surgeries you have had.  Any medical conditions you have.  Whether you are pregnant or may be pregnant. What are the risks? Generally, this is a safe procedure. However, problems may occur, including:  Infection.  Bleeding.  Allergic reactions to medicines.  A tear or hole (perforation) in the esophagus, stomach, or duodenum. What happens before the procedure? Staying hydrated Follow  instructions from your health care provider about hydration, which may include:  Up to 2 hours before the procedure - you may continue to drink clear liquids, such as water, clear fruit juice, black coffee, and plain tea.   Eating and drinking restrictions Follow instructions from your health care provider about eating and drinking, which may include:  8 hours before the procedure - stop eating heavy meals or foods, such as meat, fried foods, or fatty foods.  6 hours before the procedure - stop eating light meals or foods, such as toast or cereal.  6 hours before the procedure - stop drinking milk or drinks that contain milk.  2 hours before the procedure - stop drinking clear liquids. Medicines Ask your health care provider about:  Changing or stopping your regular medicines. This is especially important if you are taking diabetes medicines or blood thinners.  Taking medicines such as aspirin and ibuprofen. These medicines can thin your blood. Do not take these medicines unless your health care provider tells you to take them.  Taking over-the-counter medicines, vitamins, herbs, and supplements. General instructions  Plan to have someone take you home from the hospital or clinic.  If you will be going home right after the procedure, plan to have someone with you for 24 hours.  Ask your health care provider what steps will be taken to help prevent infection. What happens during the procedure?  An IV will be inserted into one of your veins.  You may be given one or more of the following: ? A medicine to help you relax (sedative). ? A  medicine to numb the throat (local anesthetic).  You will lie on your left side on an exam table.  Your health care provider will pass the endoscope through your mouth and down your esophagus.  Your health care provider will use the scope to check the inside of your esophagus, stomach, and duodenum. Biopsies may be taken.  The endoscope will be  removed. The procedure may vary among health care providers and hospitals.   What happens after the procedure?  Your blood pressure, heart rate, breathing rate, and blood oxygen level will be monitored until you leave the hospital or clinic.  Do not drive for 24 hours if you were given a sedative during your procedure.  When your throat is no longer numb, you may be given some fluids to drink.  It is up to you to get the results of your procedure. Ask your health care provider, or the department that is doing the procedure, when your results will be ready. Summary  Upper endoscopy is a procedure to look inside the upper GI tract.  During the procedure, an IV will be inserted into one of your veins. You may be given a medicine to help you relax.  A medicine will be used to numb your throat.  The endoscope will be passed through your mouth and down your esophagus. This information is not intended to replace advice given to you by your health care provider. Make sure you discuss any questions you have with your health care provider. Document Revised: 05/11/2018 Document Reviewed: 04/18/2018 Elsevier Patient Education  2021 [employer].

## 2021-03-05 NOTE — Op Note (Signed)
[facility] Patient Name: Sabrina [name] Procedure Date: 03/05/2021 8:02 AM MRN: [id number] Date of Birth: 1971-04-22 Attending MD: Lionel December , MD CSN: [id number] Age: 50 Admit Type: Outpatient Procedure:                Upper GI endoscopy Indications:              Follow-up of gastro-esophageal reflux disease Providers:                Lionel December, MD, Edrick Kins, RN, Edythe Clarity,                            Technician Referring MD:             Maximiano Coss, MD Medicines:                Propofol per Anesthesia Complications:            No immediate complications. Estimated Blood Loss:     Estimated blood loss was minimal. Procedure:                Pre-Anesthesia Assessment:                           - Prior to the procedure, a History and Physical                            was performed, and patient medications and                            allergies were reviewed. The patient's tolerance of                            previous anesthesia was also reviewed. The risks                            and benefits of the procedure and the sedation                            options and risks were discussed with the patient.                            All questions were answered, and informed consent                            was obtained. Prior Anticoagulants: The patient has                            taken no previous anticoagulant or antiplatelet                            agents. ASA Grade Assessment: II - A patient with                            mild systemic disease. After reviewing the risks  and benefits, the patient was deemed in                            satisfactory condition to undergo the procedure.                           After obtaining informed consent, the endoscope was                            passed under direct vision. Throughout the                            procedure, the patient's blood pressure, pulse, and                             oxygen saturations were monitored continuously. The                            [id number]-[id number] ([id number] scope was introduced through the                            mouth, and advanced to the second part of duodenum.                            The upper GI endoscopy was accomplished without                            difficulty. The patient tolerated the procedure                            well. Scope In: 8:15:21 AM Scope Out: 8:23:45 AM Total Procedure Duration: 0 hours 8 minutes 24 seconds  Findings:      The hypopharynx was normal.      The proximal esophagus, mid esophagus and distal esophagus were normal.      Focal edema and erythema esophagitis was found 34 cm from the incisors.      The Z-line was regular and was found 34 cm from the incisors.      A 2 cm hiatal hernia was present.      Multiple 3 to 8 mm pedunculated and sessile polyps with no bleeding and       no stigmata of recent bleeding were found in the gastric fundus and in       the gastric body. Biopsies were taken with a cold forceps for histology.       Four polyps were biopsied The pathology specimen was placed into Bottle       Number 1.      The exam of the stomach was otherwise normal.      The duodenal bulb and second portion of the duodenum were normal. Impression:               - Normal hypopharynx.                           - Normal proximal esophagus, mid esophagus and  distal esophagus.                           - Mild changes of reflux esophagitis at GEJ.                           - Z-line regular, 34 cm from the incisors.                           - 2 cm hiatal hernia.                           - Multiple gastric polyps. Biopsied.                           - Normal duodenal bulb and second portion of the                            duodenum. Moderate Sedation:      Per Anesthesia Care Recommendation:           - Patient has a contact number available for                             emergencies. The signs and symptoms of potential                            delayed complications were discussed with the                            patient. Return to normal activities tomorrow.                            Written discharge instructions were provided to the                            patient.                           - Resume previous diet today.                           - Continue present medications.                           - No aspirin, ibuprofen, naproxen, or other                            non-steroidal anti-inflammatory drugs for 1 day.                           - Await pathology results. Procedure Code(s):        --- Professional ---                           [phone], Esophagogastroduodenoscopy, flexible,  transoral; with biopsy, single or multiple Diagnosis Code(s):        --- Professional ---                           K21.00, Gastro-esophageal reflux disease with                            esophagitis, without bleeding                           K44.9, Diaphragmatic hernia without obstruction or                            gangrene                           K31.7, Polyp of stomach and duodenum CPT copyright 2019 American Medical Association. All rights reserved. The codes documented in this report are preliminary and upon coder review may  be revised to meet current compliance requirements. Lionel December, MD Lionel December, MD 03/05/2021 8:36:43 AM This report has been signed electronically. Number of Addenda: 0

## 2021-03-05 NOTE — Transfer of Care (Signed)
Immediate Anesthesia Transfer of Care Note  Patient: Sabrina [name]  Procedure(s) Performed: ESOPHAGOGASTRODUODENOSCOPY (EGD) WITH PROPOFOL (N/A ) BIOPSY  Patient Location: Short Stay  Anesthesia Type:MAC  Level of Consciousness: awake, alert , oriented and patient cooperative  Airway & Oxygen Therapy: Patient Spontanous Breathing and Patient connected to nasal cannula oxygen  Post-op Assessment: Report given to RN, Post -op Vital signs reviewed and stable and Patient moving all extremities  Post vital signs: Reviewed and stable  Last Vitals:  Vitals Value Taken Time  BP    Temp    Pulse    Resp    SpO2      Last Pain:  Vitals:   03/05/21 0810  TempSrc:   PainSc: 0-No pain      Patients Stated Pain Goal: 6 (81/27/51 7001)  Complications: No complications documented.

## 2021-03-05 NOTE — Anesthesia Postprocedure Evaluation (Signed)
Anesthesia Post Note  Patient: Sabrina [name]  Procedure(s) Performed: ESOPHAGOGASTRODUODENOSCOPY (EGD) WITH PROPOFOL (N/A ) BIOPSY  Patient location during evaluation: Phase II Anesthesia Type: General Level of consciousness: awake Pain management: pain level controlled Vital Signs Assessment: post-procedure vital signs reviewed and stable Respiratory status: spontaneous breathing and respiratory function stable Cardiovascular status: blood pressure returned to baseline and stable Postop Assessment: no headache and no apparent nausea or vomiting Anesthetic complications: no Comments: Late entry   No complications documented.   Last Vitals:  Vitals:   03/05/21 0828 03/05/21 0832  BP:  (!) 101/56  Pulse: 72   Resp: 18   Temp: 37.1 C   SpO2: 100%     Last Pain:  Vitals:   03/05/21 0832  TempSrc:   PainSc: 0-No pain                 Windell Norfolk

## 2021-03-05 NOTE — Anesthesia Preprocedure Evaluation (Signed)
Anesthesia Evaluation  Patient identified by MRN, date of birth, ID band Patient awake    Reviewed: Allergy & Precautions, H&P , NPO status , Patient's Chart, lab work & pertinent test results, reviewed documented beta blocker date and time   History of Anesthesia Complications (+) PONV and history of anesthetic complications  Airway Mallampati: II  TM Distance: >3 FB Neck ROM: full    Dental no notable dental hx.    Pulmonary neg pulmonary ROS,    Pulmonary exam normal breath sounds clear to auscultation       Cardiovascular Exercise Tolerance: Good negative cardio ROS   Rhythm:regular Rate:Normal     Neuro/Psych PSYCHIATRIC DISORDERS Anxiety negative neurological ROS     GI/Hepatic Neg liver ROS, hiatal hernia, GERD  Medicated,  Endo/Other  Hypothyroidism   Renal/GU negative Renal ROS  negative genitourinary   Musculoskeletal   Abdominal   Peds  Hematology negative hematology ROS (+)   Anesthesia Other Findings   Reproductive/Obstetrics negative OB ROS                             Anesthesia Physical Anesthesia Plan  ASA: II  Anesthesia Plan: General   Post-op Pain Management:    Induction:   PONV Risk Score and Plan: Propofol infusion  Airway Management Planned:   Additional Equipment:   Intra-op Plan:   Post-operative Plan:   Informed Consent: I have reviewed the patients History and Physical, chart, labs and discussed the procedure including the risks, benefits and alternatives for the proposed anesthesia with the patient or authorized representative who has indicated his/her understanding and acceptance.     Dental Advisory Given  Plan Discussed with: CRNA  Anesthesia Plan Comments:         Anesthesia Quick Evaluation

## 2021-03-05 NOTE — Interval H&P Note (Signed)
Patient says she has been on dexlansoprazole for 5 days and so far so good.  She is not having any side effects.  She is eating apple every day and taking Colace and her bowels moving regularly. She has no new complaints. Oropharyngeal mucosa is normal. Cardiac exam with regular rhythm normal S1 and S2.  No murmur gallop noted. Auscultation lungs reveal vesicular breath sounds bilaterally. Abdomen is soft and nontender with organomegaly or masses. Patient is agreeable to proceed with diagnostic esophagogastroduodenoscopy.  History and Physical Interval Note:  03/05/2021 8:04 AM  Sabrina [name]  has presented today for surgery, with the diagnosis of GERD Constipation.  The various methods of treatment have been discussed with the patient and family. After consideration of risks, benefits and other options for treatment, the patient has consented to  Procedure(s) with comments: ESOPHAGOGASTRODUODENOSCOPY (EGD) WITH PROPOFOL (N/A) - AM as a surgical intervention.  The patient's history has been reviewed, patient examined, no change in status, stable for surgery.  I have reviewed the patient's chart and labs.  Questions were answered to the patient's satisfaction.     [employer]

## 2021-03-06 LAB — SURGICAL PATHOLOGY

## 2021-03-10 ENCOUNTER — Encounter (HOSPITAL_COMMUNITY): Payer: Self-pay | Admitting: Internal Medicine

## 2021-03-11 ENCOUNTER — Ambulatory Visit (INDEPENDENT_AMBULATORY_CARE_PROVIDER_SITE_OTHER): Payer: BC Managed Care – PPO | Admitting: Internal Medicine

## 2021-03-17 ENCOUNTER — Other Ambulatory Visit: Payer: Self-pay

## 2021-03-17 ENCOUNTER — Ambulatory Visit (HOSPITAL_COMMUNITY)
Admission: RE | Admit: 2021-03-17 | Discharge: 2021-03-17 | Disposition: A | Discharge status: Home / Self Care | Payer: BC Managed Care – PPO | Source: Ambulatory Visit | Attending: Urology | Admitting: Urology

## 2021-03-17 DIAGNOSIS — N200 Calculus of kidney: Secondary | ICD-10-CM | POA: Diagnosis not present

## 2021-03-25 NOTE — Progress Notes (Signed)
Sent via mychart

## 2021-03-26 ENCOUNTER — Other Ambulatory Visit: Payer: Self-pay

## 2021-03-26 ENCOUNTER — Telehealth (INDEPENDENT_AMBULATORY_CARE_PROVIDER_SITE_OTHER): Payer: BC Managed Care – PPO | Admitting: Urology

## 2021-03-26 DIAGNOSIS — N200 Calculus of kidney: Secondary | ICD-10-CM | POA: Diagnosis not present

## 2021-03-26 NOTE — H&P (View-Only) (Signed)
03/26/2021 9:12 AM   Sabrina [name] 04-Nov-1971 [id number]  Referring provider: Maximiano Coss, MD [facility] [address] [place],  [place] [address]   Patient location: home Physician location: office I connected with  Sabrina [name] on 04/08/21 by a video enabled telemedicine application and verified that I am speaking with the correct person using two identifiers.   I discussed the limitations of evaluation and management by telemedicine. The patient expressed understanding and agreed to proceed.    nephrolithiasis  HPI: Sabrina [name] is a 49yo seen today for nephrolithiasis. She denies any worsening flank pain. She denies any hematuria. No stone events since last visit. Renal US from 4/18 showed a 2mm left renal calculus. KUB shows a 66mm left renal calculus. No other complaints today   PMH: Past Medical History:  Diagnosis Date  . Anxiety   . GERD (gastroesophageal reflux disease)   . History of hiatal hernia   . History of kidney stones   . Hypothyroidism   . Hypothyroidism   . PONV (postoperative nausea and vomiting)     Surgical History: Past Surgical History:  Procedure Laterality Date  . BIOPSY  03/05/2021   Procedure: BIOPSY;  Surgeon: Malissa Hippo, MD;  Location: [facility];  Service: Endoscopy;;  bx of gastric polyps  . CESAREAN SECTION  2011  . CHOLECYSTECTOMY N/A 09/13/2020   Procedure: LAPAROSCOPIC CHOLECYSTECTOMY;  Surgeon: Franky Macho, MD;  Location: [facility];  Service: General;  Laterality: N/A;  . ESOPHAGOGASTRODUODENOSCOPY (EGD) WITH PROPOFOL N/A 03/05/2021   Procedure: ESOPHAGOGASTRODUODENOSCOPY (EGD) WITH PROPOFOL;  Surgeon: Malissa Hippo, MD;  Location: [facility];  Service: Endoscopy;  Laterality: N/A;  AM  . HERNIA REPAIR  2015  . LASIK  2019  . LITHOTRIPSY  2017  . NASAL SINUS SURGERY     x2  . TUBAL LIGATION      Home Medications:  Allergies as of 03/26/2021      Reactions    Erythromycin Nausea And Vomiting   Nitrofurantoin Palpitations      Medication List       Accurate as of March 26, 2021  9:12 AM. If you have any questions, ask your nurse or doctor.        busPIRone 7.5 MG tablet Commonly known as: BUSPAR Take 7.5 mg by mouth 2 (two) times daily.   cholecalciferol 25 MCG (1000 UNIT) tablet Commonly known as: VITAMIN D3 Take 1,000 Units by mouth daily.   dexlansoprazole 60 MG capsule Commonly known as: DEXILANT Take 1 capsule (60 mg total) by mouth daily before breakfast.   docusate sodium 100 MG capsule Commonly known as: COLACE Take 2 capsules (200 mg total) by mouth at bedtime.   eletriptan 20 MG tablet Commonly known as: RELPAX Take 20 mg by mouth as needed for migraine or headache. May repeat in 2 hours if headache persists or recurs.   FLUoxetine 10 MG capsule Commonly known as: PROZAC Take 10 mg by mouth daily.   levocetirizine 5 MG tablet Commonly known as: XYZAL Take 5 mg by mouth every evening.   levothyroxine 100 MCG tablet Commonly known as: SYNTHROID Take 100 mcg by mouth daily before breakfast.   Selenium 200 MCG Caps Take 200 mcg by mouth daily.   vitamin C 1000 MG tablet Take 1,000 mg by mouth daily.       Allergies:  Allergies  Allergen Reactions  . Erythromycin Nausea And Vomiting  . Nitrofurantoin Palpitations  Family History: Family History  Problem Relation Age of Onset  . Cancer Mother   . Breast cancer Mother   . Heart disease Father   . Diabetes Maternal Grandfather   . Thyroid disease Paternal Grandmother   . Colon cancer Paternal Grandmother   . Renal cancer Paternal Grandmother   . Glaucoma Brother   . Anxiety disorder Brother   . Hypercholesterolemia Brother   . Healthy Son   . Irritable bowel syndrome Son     Social History:  reports that she has never smoked. She has never used smokeless tobacco. She reports that she does not drink alcohol and does not use drugs.  ROS: All  other review of systems were reviewed and are negative except what is noted above in HPI  Physical Exam: LMP 02/28/2021 (Approximate)   Constitutional:  Alert and oriented, No acute distress. HEENT: [place] AT, moist mucus membranes.  Trachea midline, no masses. Cardiovascular: No clubbing, cyanosis, or edema. Respiratory: Normal respiratory effort, no increased work of breathing. GI: Abdomen is soft, nontender, nondistended, no abdominal masses GU: No CVA tenderness.  Lymph: No cervical or inguinal lymphadenopathy. Skin: No rashes, bruises or suspicious lesions. Neurologic: Grossly intact, no focal deficits, moving all 4 extremities. Psychiatric: Normal mood and affect.  Laboratory Data: Lab Results  Component Value Date   WBC 8.9 08/19/2010   HGB 9.5 (L) 08/19/2010   HCT 28.2 (L) 08/19/2010   MCV 87.5 08/19/2010   PLT 165 08/19/2010    Lab Results  Component Value Date   CREATININE 0.61 08/13/2010    No results found for: PSA  No results found for: TESTOSTERONE  No results found for: HGBA1C  Urinalysis    Component Value Date/Time   COLORURINE YELLOW 08/13/2010 1617   APPEARANCEUR Clear 11/13/2020 1014   LABSPEC 1.015 08/13/2010 1617   PHURINE 7.5 08/13/2010 1617   GLUCOSEU Negative 11/13/2020 1014   HGBUR NEGATIVE 08/13/2010 1617   BILIRUBINUR Negative 11/13/2020 1014   KETONESUR NEGATIVE 08/13/2010 1617   PROTEINUR Negative 11/13/2020 1014   PROTEINUR NEGATIVE 08/13/2010 1617   UROBILINOGEN 0.2 08/13/2010 1617   NITRITE Negative 11/13/2020 1014   NITRITE NEGATIVE 08/13/2010 1617   LEUKOCYTESUR Negative 11/13/2020 1014    Lab Results  Component Value Date   LABMICR See below: 11/13/2020   WBCUA 0-5 11/13/2020   LABEPIT 0-10 11/13/2020   BACTERIA Few (A) 11/13/2020    Pertinent Imaging: Renal US 4/18: Images reviewed and discussed with the patient Results for orders placed in visit on 11/13/20  Abdomen 1 view (KUB)  Narrative CLINICAL DATA:   Nephrolithiasis  EXAM: ABDOMEN - 1 VIEW  COMPARISON:  None.  FINDINGS: The bowel gas pattern is normal. No radio-opaque calculi or other significant radiographic abnormality are seen. Right tubal ligation clip. Likely migration of the left tubal ligation clip which projects within the mid right hemiabdomen. Cholecystectomy clips are present.  IMPRESSION: 1. No radiopaque renal calculi identified. 2. Likely migration of the left tubal ligation clip which projects within the mid right hemiabdomen.   Electronically Signed By: Duanne Guess D.O. On: 11/13/2020 15:41  No results found for this or any previous visit.  No results found for this or any previous visit.  No results found for this or any previous visit.  Results for orders placed during the hospital encounter of 03/17/21  Ultrasound renal complete  Narrative CLINICAL DATA:  Nephrolithiasis.  EXAM: RENAL / URINARY TRACT ULTRASOUND COMPLETE  COMPARISON:  No prior.  FINDINGS: Right Kidney:  Renal measurements: 11.1 x 5.1 x 5.6 cm = volume: 165.3 mL. Echogenicity within normal limits. No mass. Minimal prominence of the renal pelvis, possibly secondary to a extrarenal pelvis. No calyceal dilatation.  Left Kidney:  Renal measurements: 10.8 x 4.7 x 5.3 cm = volume: 141.3 mL. Echogenicity within normal limits. No mass. Minimal prominence of the renal pelvis possibly secondary to extrarenal pelvis. No calyceal dilatation. Multiple nonobstructing renal calyceal stones, largest measuring 11 mm.  Bladder:  Appears normal for degree of bladder distention. Bilateral ureteral jets visualized.  Other:  None.  IMPRESSION: 1. Prominence of the renal pelvis noted bilaterally. This may be secondary to extrarenal pelves. No calyceal dilatation. Bilateral ureteral jets noted.  2. Multiple nonobstructing left renal calyceal stones, largest measures 11 mm.   Electronically Signed By: Thomas  Register On:  03/18/2021 07:44  No results found for this or any previous visit.  No results found for this or any previous visit.  No results found for this or any previous visit.   Assessment & Plan:   1. Nephrolithiasis --We discussed the management of kidney stones. These options include observation, ureteroscopy, shockwave lithotripsy (ESWL) and percutaneous nephrolithotomy (PCNL). We discussed which options are relevant to the patient's stone(s). We discussed the natural history of kidney stones as well as the complications of untreated stones and the impact on quality of life without treatment as well as with each of the above listed treatments. We also discussed the efficacy of each treatment in its ability to clear the stone burden. With any of these management options I discussed the signs and symptoms of infection and the need for emergent treatment should these be experienced. For each option we discussed the ability of each procedure to clear the patient of their stone burden.   For observation I described the risks which include but are not limited to silent renal damage, life-threatening infection, need for emergent surgery, failure to pass stone and pain.   For ureteroscopy I described the risks which include bleeding, infection, damage to contiguous structures, positioning injury, ureteral stricture, ureteral avulsion, ureteral injury, need for prolonged ureteral stent, inability to perform ureteroscopy, need for an interval procedure, inability to clear stone burden, stent discomfort/pain, heart attack, stroke, pulmonary embolus and the inherent risks with general anesthesia.   For shockwave lithotripsy I described the risks which include arrhythmia, kidney contusion, kidney hemorrhage, need for transfusion, pain, inability to adequately break up stone, inability to pass stone fragments, Steinstrasse, infection associated with obstructing stones, need for alternate surgical procedure, need for  repeat shockwave lithotripsy, MI, CVA, PE and the inherent risks with anesthesia/conscious sedation.   For PCNL I described the risks including positioning injury, pneumothorax, hydrothorax, need for chest tube, inability to clear stone burden, renal laceration, arterial venous fistula or malformation, need for embolization of kidney, loss of kidney or renal function, need for repeat procedure, need for prolonged nephrostomy tube, ureteral avulsion, MI, CVA, PE and the inherent risks of general anesthesia.   - The patient would like to proceed with Left ESWL  No follow-ups on file.  Lazlo Tunney, MD  [facility] [place]  

## 2021-03-26 NOTE — Progress Notes (Signed)
03/26/2021 9:12 AM   Sabrina [name] 04-Nov-1971 [id number]  Referring provider: Maximiano Coss, MD [facility] [address] [place],  [place] [address]   Patient location: home Physician location: office I connected with  Sabrina [name] on 04/08/21 by a video enabled telemedicine application and verified that I am speaking with the correct person using two identifiers.   I discussed the limitations of evaluation and management by telemedicine. The patient expressed understanding and agreed to proceed.    nephrolithiasis  HPI: [name] is a 49yo seen today for nephrolithiasis. She denies any worsening flank pain. She denies any hematuria. No stone events since last visit. Renal US from 4/18 showed a 2mm left renal calculus. KUB shows a 66mm left renal calculus. No other complaints today   PMH: Past Medical History:  Diagnosis Date  . Anxiety   . GERD (gastroesophageal reflux disease)   . History of hiatal hernia   . History of kidney stones   . Hypothyroidism   . Hypothyroidism   . PONV (postoperative nausea and vomiting)     Surgical History: Past Surgical History:  Procedure Laterality Date  . BIOPSY  03/05/2021   Procedure: BIOPSY;  Surgeon: Malissa Hippo, MD;  Location: [facility];  Service: Endoscopy;;  bx of gastric polyps  . CESAREAN SECTION  2011  . CHOLECYSTECTOMY N/A 09/13/2020   Procedure: LAPAROSCOPIC CHOLECYSTECTOMY;  Surgeon: Franky Macho, MD;  Location: [facility];  Service: General;  Laterality: N/A;  . ESOPHAGOGASTRODUODENOSCOPY (EGD) WITH PROPOFOL N/A 03/05/2021   Procedure: ESOPHAGOGASTRODUODENOSCOPY (EGD) WITH PROPOFOL;  Surgeon: Malissa Hippo, MD;  Location: [facility];  Service: Endoscopy;  Laterality: N/A;  AM  . HERNIA REPAIR  2015  . LASIK  2019  . LITHOTRIPSY  2017  . NASAL SINUS SURGERY     x2  . TUBAL LIGATION      Home Medications:  Allergies as of 03/26/2021      Reactions    Erythromycin Nausea And Vomiting   Nitrofurantoin Palpitations      Medication List       Accurate as of March 26, 2021  9:12 AM. If you have any questions, ask your nurse or doctor.        busPIRone 7.5 MG tablet Commonly known as: BUSPAR Take 7.5 mg by mouth 2 (two) times daily.   cholecalciferol 25 MCG (1000 UNIT) tablet Commonly known as: VITAMIN D3 Take 1,000 Units by mouth daily.   dexlansoprazole 60 MG capsule Commonly known as: DEXILANT Take 1 capsule (60 mg total) by mouth daily before breakfast.   docusate sodium 100 MG capsule Commonly known as: COLACE Take 2 capsules (200 mg total) by mouth at bedtime.   eletriptan 20 MG tablet Commonly known as: RELPAX Take 20 mg by mouth as needed for migraine or headache. May repeat in 2 hours if headache persists or recurs.   FLUoxetine 10 MG capsule Commonly known as: PROZAC Take 10 mg by mouth daily.   levocetirizine 5 MG tablet Commonly known as: XYZAL Take 5 mg by mouth every evening.   levothyroxine 100 MCG tablet Commonly known as: SYNTHROID Take 100 mcg by mouth daily before breakfast.   Selenium 200 MCG Caps Take 200 mcg by mouth daily.   vitamin C 1000 MG tablet Take 1,000 mg by mouth daily.       Allergies:  Allergies  Allergen Reactions  . Erythromycin Nausea And Vomiting  . Nitrofurantoin Palpitations  Family History: Family History  Problem Relation Age of Onset  . Cancer Mother   . Breast cancer Mother   . Heart disease Father   . Diabetes Maternal Grandfather   . Thyroid disease Paternal Grandmother   . Colon cancer Paternal Grandmother   . Renal cancer Paternal Grandmother   . Glaucoma Brother   . Anxiety disorder Brother   . Hypercholesterolemia Brother   . Healthy Son   . Irritable bowel syndrome Son     Social History:  reports that she has never smoked. She has never used smokeless tobacco. She reports that she does not drink alcohol and does not use drugs.  ROS: All  other review of systems were reviewed and are negative except what is noted above in HPI  Physical Exam: LMP 02/28/2021 (Approximate)   Constitutional:  Alert and oriented, No acute distress. HEENT: [place] AT, moist mucus membranes.  Trachea midline, no masses. Cardiovascular: No clubbing, cyanosis, or edema. Respiratory: Normal respiratory effort, no increased work of breathing. GI: Abdomen is soft, nontender, nondistended, no abdominal masses GU: No CVA tenderness.  Lymph: No cervical or inguinal lymphadenopathy. Skin: No rashes, bruises or suspicious lesions. Neurologic: Grossly intact, no focal deficits, moving all 4 extremities. Psychiatric: Normal mood and affect.  Laboratory Data: Lab Results  Component Value Date   WBC 8.9 08/19/2010   HGB 9.5 (L) 08/19/2010   HCT 28.2 (L) 08/19/2010   MCV 87.5 08/19/2010   PLT 165 08/19/2010    Lab Results  Component Value Date   CREATININE 0.61 08/13/2010    No results found for: PSA  No results found for: TESTOSTERONE  No results found for: HGBA1C  Urinalysis    Component Value Date/Time   COLORURINE YELLOW 08/13/2010 1617   APPEARANCEUR Clear 11/13/2020 1014   LABSPEC 1.015 08/13/2010 1617   PHURINE 7.5 08/13/2010 1617   GLUCOSEU Negative 11/13/2020 1014   HGBUR NEGATIVE 08/13/2010 1617   BILIRUBINUR Negative 11/13/2020 1014   KETONESUR NEGATIVE 08/13/2010 1617   PROTEINUR Negative 11/13/2020 1014   PROTEINUR NEGATIVE 08/13/2010 1617   UROBILINOGEN 0.2 08/13/2010 1617   NITRITE Negative 11/13/2020 1014   NITRITE NEGATIVE 08/13/2010 1617   LEUKOCYTESUR Negative 11/13/2020 1014    Lab Results  Component Value Date   LABMICR See below: 11/13/2020   WBCUA 0-5 11/13/2020   LABEPIT 0-10 11/13/2020   BACTERIA Few (A) 11/13/2020    Pertinent Imaging: Renal US 4/18: Images reviewed and discussed with the patient Results for orders placed in visit on 11/13/20  Abdomen 1 view (KUB)  Narrative CLINICAL DATA:   Nephrolithiasis  EXAM: ABDOMEN - 1 VIEW  COMPARISON:  None.  FINDINGS: The bowel gas pattern is normal. No radio-opaque calculi or other significant radiographic abnormality are seen. Right tubal ligation clip. Likely migration of the left tubal ligation clip which projects within the mid right hemiabdomen. Cholecystectomy clips are present.  IMPRESSION: 1. No radiopaque renal calculi identified. 2. Likely migration of the left tubal ligation clip which projects within the mid right hemiabdomen.   Electronically Signed By: Duanne Guess D.O. On: 11/13/2020 15:41  No results found for this or any previous visit.  No results found for this or any previous visit.  No results found for this or any previous visit.  Results for orders placed during the hospital encounter of 03/17/21  Ultrasound renal complete  Narrative CLINICAL DATA:  Nephrolithiasis.  EXAM: RENAL / URINARY TRACT ULTRASOUND COMPLETE  COMPARISON:  No prior.  FINDINGS: Right Kidney:  Renal measurements: 11.1 x 5.1 x 5.6 cm = volume: 165.3 mL. Echogenicity within normal limits. No mass. Minimal prominence of the renal pelvis, possibly secondary to a extrarenal pelvis. No calyceal dilatation.  Left Kidney:  Renal measurements: 10.8 x 4.7 x 5.3 cm = volume: 141.3 mL. Echogenicity within normal limits. No mass. Minimal prominence of the renal pelvis possibly secondary to extrarenal pelvis. No calyceal dilatation. Multiple nonobstructing renal calyceal stones, largest measuring 11 mm.  Bladder:  Appears normal for degree of bladder distention. Bilateral ureteral jets visualized.  Other:  None.  IMPRESSION: 1. Prominence of the renal pelvis noted bilaterally. This may be secondary to extrarenal pelves. No calyceal dilatation. Bilateral ureteral jets noted.  2. Multiple nonobstructing left renal calyceal stones, largest measures 11 mm.   Electronically Signed By: Maisie Fus  Register On:  03/18/2021 07:44  No results found for this or any previous visit.  No results found for this or any previous visit.  No results found for this or any previous visit.   Assessment & Plan:   1. Nephrolithiasis --We discussed the management of kidney stones. These options include observation, ureteroscopy, shockwave lithotripsy (ESWL) and percutaneous nephrolithotomy (PCNL). We discussed which options are relevant to the patient's stone(s). We discussed the natural history of kidney stones as well as the complications of untreated stones and the impact on quality of life without treatment as well as with each of the above listed treatments. We also discussed the efficacy of each treatment in its ability to clear the stone burden. With any of these management options I discussed the signs and symptoms of infection and the need for emergent treatment should these be experienced. For each option we discussed the ability of each procedure to clear the patient of their stone burden.   For observation I described the risks which include but are not limited to silent renal damage, life-threatening infection, need for emergent surgery, failure to pass stone and pain.   For ureteroscopy I described the risks which include bleeding, infection, damage to contiguous structures, positioning injury, ureteral stricture, ureteral avulsion, ureteral injury, need for prolonged ureteral stent, inability to perform ureteroscopy, need for an interval procedure, inability to clear stone burden, stent discomfort/pain, heart attack, stroke, pulmonary embolus and the inherent risks with general anesthesia.   For shockwave lithotripsy I described the risks which include arrhythmia, kidney contusion, kidney hemorrhage, need for transfusion, pain, inability to adequately break up stone, inability to pass stone fragments, Steinstrasse, infection associated with obstructing stones, need for alternate surgical procedure, need for  repeat shockwave lithotripsy, MI, CVA, PE and the inherent risks with anesthesia/conscious sedation.   For PCNL I described the risks including positioning injury, pneumothorax, hydrothorax, need for chest tube, inability to clear stone burden, renal laceration, arterial venous fistula or malformation, need for embolization of kidney, loss of kidney or renal function, need for repeat procedure, need for prolonged nephrostomy tube, ureteral avulsion, MI, CVA, PE and the inherent risks of general anesthesia.   - The patient would like to proceed with Left ESWL  No follow-ups on file.  Wilkie Aye, MD  [facility]

## 2021-03-27 ENCOUNTER — Ambulatory Visit (HOSPITAL_COMMUNITY)
Admission: RE | Admit: 2021-03-27 | Discharge: 2021-03-27 | Disposition: A | Discharge status: Home / Self Care | Payer: BC Managed Care – PPO | Source: Ambulatory Visit | Attending: Urology | Admitting: Urology

## 2021-03-27 ENCOUNTER — Other Ambulatory Visit: Payer: Self-pay

## 2021-03-27 ENCOUNTER — Telehealth: Payer: Self-pay

## 2021-03-27 DIAGNOSIS — N200 Calculus of kidney: Secondary | ICD-10-CM | POA: Insufficient documentation

## 2021-03-27 NOTE — Telephone Encounter (Signed)
Ok, thank you

## 2021-03-27 NOTE — Telephone Encounter (Signed)
Letting office know Xray was done this morning.  Thanks, Rosey Bath

## 2021-03-27 NOTE — Telephone Encounter (Signed)
Please review  See below

## 2021-03-28 NOTE — Telephone Encounter (Signed)
See my chart note.

## 2021-04-08 ENCOUNTER — Encounter: Payer: Self-pay | Admitting: Urology

## 2021-04-08 NOTE — Patient Instructions (Signed)
[employer] (25th ed., pp. 811-816). [place], [place]: [employer], Else[employer]. Retrieved from https://www.clinicalkey.com/#!/content/book/3-s2.0-B9781455750177001264?scrollTo=%23hl0000287">  Lithotripsy  Lithotripsy is a treatment that can help break up kidney stones that are too large to pass on their own. This is a nonsurgical procedure that crushes a kidney stone with shock waves. These shock waves pass through your body and focus on the kidney stone. They cause the kidney stone to break up into smaller pieces while it is still in the urinary tract. The smaller pieces of stone can pass more easily out of your body in the urine. Tell a health care provider about:  Any allergies you have.  All medicines you are taking, including vitamins, herbs, eye drops, creams, and over-the-counter medicines.  Any problems you or family members have had with anesthetic medicines.  Any blood disorders you have.  Any surgeries you have had.  Any medical conditions you have.  Whether you are pregnant or may be pregnant. What are the risks? Generally, this is a safe procedure. However, problems may occur, including:  Infection.  Bleeding from the kidney.  Bruising of the kidney or skin.  Scarring of the kidney, which can lead to: ? Increased blood pressure. ? Poor kidney function. ? Return (recurrence) of kidney stones.  Damage to other structures or organs, such as the liver, colon, spleen, or pancreas.  Blockage (obstruction) of the tube that carries urine from the kidney to the bladder (ureter).  Failure of the kidney stone to break into pieces (fragments). What happens before the procedure? Staying hydrated Follow instructions from your health care provider about hydration, which may include:  Up to 2 hours before the procedure - you may continue to drink clear liquids, such as water, clear fruit juice, black coffee, and plain tea. Eating and drinking restrictions Follow  instructions from your health care provider about eating and drinking, which may include:  8 hours before the procedure - stop eating heavy meals or foods, such as meat, fried foods, or fatty foods.  6 hours before the procedure - stop eating light meals or foods, such as toast or cereal.  6 hours before the procedure - stop drinking milk or drinks that contain milk.  2 hours before the procedure - stop drinking clear liquids. Medicines Ask your health care provider about:  Changing or stopping your regular medicines. This is especially important if you are taking diabetes medicines or blood thinners.  Taking medicines such as aspirin and ibuprofen. These medicines can thin your blood. Do not take these medicines unless your health care provider tells you to take them.  Taking over-the-counter medicines, vitamins, herbs, and supplements. Tests You may have tests, such as:  Blood tests.  Urine tests.  Imaging tests, such as a CT scan. General instructions  Plan to have someone take you home from the hospital or clinic.  If you will be going home right after the procedure, plan to have someone with you for 24 hours.  Ask your health care provider what steps will be taken to help prevent infection. These may include washing skin with a germ-killing soap. What happens during the procedure?  An IV will be inserted into one of your veins.  You will be given one or more of the following: ? A medicine to help you relax (sedative). ? A medicine to make you fall asleep (general anesthetic).  A water-filled cushion may be placed behind your kidney or on your abdomen. In some cases, you may be placed in a tub of   lukewarm water.  Your body will be positioned in a way that makes it easy to target the kidney stone.  An X-ray or ultrasound exam will be done to locate your stone.  Shock waves will be aimed at the stone. If you are awake, you may feel a tapping sensation as the shock  waves pass through your body.  A flexible tube with holes in it (stent) may be placed in the ureter. This will help keep urine flowing from the kidney if the fragments of the stone have been blocking the ureter. The procedure may vary among health care providers and hospitals.   What happens after the procedure?  You may have an X-ray to see whether the procedure was able to break up the kidney stone and how much of the stone has passed. If large stone fragments remain after treatment, you may need to have a second procedure at a later time.  Your blood pressure, heart rate, breathing rate, and blood oxygen level will be monitored until you leave the hospital or clinic.  You may be given antibiotics or pain medicine as needed.  If a stent was placed in your ureter during surgery, it may stay in place for a few weeks.  You may need to strain your urine to collect pieces of the kidney stone for testing.  You will need to drink plenty of water.  If you were given a sedative during the procedure, it can affect you for several hours. Do not drive or operate machinery until your health care provider says that it is safe. Summary  Lithotripsy is a treatment that can help break up kidney stones that are too large to pass on their own.  Lithotripsy is a nonsurgical procedure that crushes a kidney stone with shock waves.  Generally, this is a safe procedure. However, problems may occur, including damage to the kidney or other organs, infection, or obstruction of the tube that carries urine from the kidney to the bladder (ureter).  You may have a stent placed in your ureter to help drain your urine. This stent may stay in place for a few weeks.  After the procedure, you will need to drink plenty of water. You may be asked to strain your urine to collect pieces of the kidney stone for testing. This information is not intended to replace advice given to you by your health care provider. Make sure  you discuss any questions you have with your health care provider. Document Revised: 08/30/2019 Document Reviewed: 08/30/2019 Elsevier Patient Education  2021 [employer]vier Inc.  

## 2021-04-10 ENCOUNTER — Ambulatory Visit: Payer: BC Managed Care – PPO | Admitting: Internal Medicine

## 2021-04-10 ENCOUNTER — Encounter: Payer: Self-pay | Admitting: Internal Medicine

## 2021-04-10 ENCOUNTER — Encounter (HOSPITAL_COMMUNITY)
Admission: RE | Admit: 2021-04-10 | Discharge: 2021-04-10 | Disposition: A | Discharge status: Home / Self Care | Payer: BC Managed Care – PPO | Source: Ambulatory Visit | Attending: Urology | Admitting: Urology

## 2021-04-10 ENCOUNTER — Telehealth: Payer: Self-pay

## 2021-04-10 ENCOUNTER — Other Ambulatory Visit: Payer: Self-pay

## 2021-04-10 VITALS — BP 110/78 | HR 66 | Ht 64.0 in | Wt 164.6 lb

## 2021-04-10 DIAGNOSIS — Z8639 Personal history of other endocrine, nutritional and metabolic disease: Secondary | ICD-10-CM | POA: Diagnosis not present

## 2021-04-10 DIAGNOSIS — E038 Other specified hypothyroidism: Secondary | ICD-10-CM | POA: Diagnosis not present

## 2021-04-10 DIAGNOSIS — E063 Autoimmune thyroiditis: Secondary | ICD-10-CM

## 2021-04-10 NOTE — Telephone Encounter (Signed)
Pt is coming to [place] on Mon at 8 for COVID test.  Wants to know if she can stop by office after test to pick up blue folder for procedure being done on Tues.  Please advise.  Call back:  [phone]  Thanks, Rosey Bath

## 2021-04-10 NOTE — Patient Instructions (Addendum)
Please continue Levothyroxine 100 mcg daily.  Take the thyroid hormone every day, with water, at least 30 minutes before breakfast, separated by at least 4 hours from: - acid reflux medications - calcium - iron - multivitamins  Please move Dexilant >3h after Levothyroxine.  Come back for labs in 5 weeks.  Please come back for a follow-up appointment in 6 months.

## 2021-04-10 NOTE — Progress Notes (Signed)
Patient ID: Sabrina [name], female   DOB: 04/15/1971, 50 y.o.   MRN: [id number]   This visit occurred during the SARS-CoV-2 public health emergency.  Safety protocols were in place, including screening questions prior to the visit, additional usage of staff PPE, and extensive cleaning of exam room while observing appropriate contact time as indicated for disinfecting solutions.   HPI  Sabrina [name] is a 50 y.o.-year-old female, initially referred by her PCP, Dr. Norval Gable (PCP in [place]), returning for follow-up for Hashimoto's hypothyroidism.  She also has a history of thyroid nodule.  She previously saw several endocrinologists. She lives in [place].  She sees Dr. Vincente Poli.  Last visit with me 6 months ago.  Interim history: She has kidney stones and will have ESWL 04/15/2021. She had chest pain and acid reflux.  She had an endoscopy and 1 month ago she was started on Dexilant.  Hypothyroidism: - dx'ed in 2014, during investigation for fatigue. At that time, she had thyroid imaging and was found to have thyroid nodules.  She is on levothyroxine 100 mcg: - fasting - with water - separated by >30 min from b'fast  - no calcium, iron - + multivitamins later in the day - + on Protonix 40 mg daily at bedtime >> changed to Dexilant 1 mo ago (60 mg) 30 min after LT4 On vit C, D, Zn.  I reviewed pt's thyroid tests: Lab Results  Component Value Date   TSH 0.87 10/10/2020   FREET4 1.19 10/10/2020  07/04/2020: TSH 1.27 03/21/2020: TSH 1.06, free T4 1.4 02/23/2020: TSH 1.34 (0.45-4.5), free T4 1.37, total T3 101 04/10/2014: TSH 0.86 (0.34-5.66), free T4 1.12 (0.52-1.21)  Antithyroid antibodies: Component     Latest Ref Rng & Units 10/10/2020  Thyroglobulin Ab     < or = 1 IU/mL 2 (H)  Thyroperoxidase Ab SerPl-aCnc     <9 IU/mL 86 (H)  03/21/2020: TPO antibodies 169 02/23/2020: TPO antibodies 190 (0-34) No components found for: TPOAB   We started selenium 200 mcg daily in  09/2020.  At last visit, she described: - weight gain - fatigue - cold intolerance - depression - constipation - dry skin - hair loss  At this visit:  -mild fatigue -Constipation  Thyroid nodule:  Reviewed previous investigation: Thyroid ultrasound (02/2013): 1.7 x 1.03 cm right mid thyroid nodule Thyroid ultrasound in office-Dr. Esclamado (08/2013): No obvious thyroid nodule Thyroid ultrasound (04/10/2014): Heterogeneous, thyroid lobes, without thyroid nodules.  Small lymph nodes, benign-appearing.  Thyroid ultrasound (02/20/2020): Heterogeneous thyroid with increased vascularity throughout the entire gland, no nodules  Pt denies: - feeling nodules in neck - dysphagia - choking - SOB with lying down She described increased fullness in her neck and also hoarseness after having had COVID-19 in 01/2020.  Interestingly, the fullness in her neck improved after gallbladder surgery 08/2020.    She has + FH of thyroid disorders in: PGM. 3 great aunts with thyroid ds. No FH of thyroid cancer.  No h/o radiation tx to head or neck. No recent use of iodine supplements.  She has a h/o 2 miscarriages. She had + ANA.  She saw rheumatology in the past.  ROS: Constitutional: no weight gain/loss, + fatigue, no subjective hyperthermia/hypothermia Eyes: no blurry vision, no xerophthalmia ENT: no sore throat, + see HPI Cardiovascular: no CP/SOB/palpitations/leg swelling Respiratory: no cough/SOB Gastrointestinal: no N/V/D/ + C/+ acid reflux Musculoskeletal: no Muscle/joint aches Skin: no rashes Neurological: no tremors/numbness/tingling/dizziness  Past Medical History:  Diagnosis Date  . Anxiety   .  GERD (gastroesophageal reflux disease)   . History of hiatal hernia   . History of kidney stones   . Hypothyroidism   . Hypothyroidism   . PONV (postoperative nausea and vomiting)    Past Surgical History:  Procedure Laterality Date  . BIOPSY  03/05/2021   Procedure: BIOPSY;  Surgeon:  Malissa Hippo, MD;  [facility];  Service: Endoscopy;;  bx of gastric polyps  . CESAREAN SECTION  2011  . CHOLECYSTECTOMY N/A 09/13/2020   Procedure: LAPAROSCOPIC CHOLECYSTECTOMY;  Surgeon: Franky Macho, MD;  Location: [facility];  Service: General;  Laterality: N/A;  . ESOPHAGOGASTRODUODENOSCOPY (EGD) WITH PROPOFOL N/A 03/05/2021   Procedure: ESOPHAGOGASTRODUODENOSCOPY (EGD) WITH PROPOFOL;  Surgeon: Malissa Hippo, MD;  [facility];  Service: Endoscopy;  Laterality: N/A;  AM  . HERNIA REPAIR  2015  . LASIK  2019  . LITHOTRIPSY  2017  . NASAL SINUS SURGERY     x2  . TUBAL LIGATION     Social History   Socioeconomic History  . Marital status: Married    Spouse name: Not on file  . Number of children: 2  . Years of education: Not on file  . Highest education level: Not on file  Occupational History  . Occupation: Print production planner  Tobacco Use  . Smoking status: Never Smoker  . Smokeless tobacco: Never Used  Substance and Sexual Activity  . Alcohol use: Never  . Drug use: Never  . Sexual activity: Yes  Other Topics Concern  . Not on file  Social History Narrative   ** Merged History Encounter **       She has 1 adopted child, 48 years old in 09/2020   Social Determinants of Health   Financial Resource Strain: Not on file  Food Insecurity: Not on file  Transportation Needs: Not on file  Physical Activity: Not on file  Stress: Not on file  Social Connections: Not on file  Intimate Partner Violence: Not on file   Current Outpatient Medications on File Prior to Visit  Medication Sig Dispense Refill  . busPIRone (BUSPAR) 7.5 MG tablet Take 7.5 mg by mouth 2 (two) times daily.     Marland Kitchen dexlansoprazole (DEXILANT) 60 MG capsule Take 1 capsule (60 mg total) by mouth daily before breakfast. 30 capsule 5  . docusate sodium (COLACE) 100 MG capsule Take 2 capsules (200 mg total) by mouth at bedtime. (Patient not taking: Reported on 04/03/2021)    . eletriptan  (RELPAX) 20 MG tablet Take 20 mg by mouth as needed for migraine or headache. May repeat in 2 hours if headache persists or recurs.    Marland Kitchen FLUoxetine (PROZAC) 10 MG capsule Take 10 mg by mouth daily.    . fluticasone (FLONASE) 50 MCG/ACT nasal spray Place into both nostrils daily.    Marland Kitchen levocetirizine (XYZAL) 5 MG tablet Take 5 mg by mouth every evening.    Marland Kitchen levothyroxine (SYNTHROID) 100 MCG tablet Take 100 mcg by mouth daily before breakfast.     . Selenium 200 MCG CAPS Take 200 mcg by mouth daily.    . valACYclovir (VALTREX) 500 MG tablet Take 500 mg by mouth daily as needed (outbreak).     No current facility-administered medications on file prior to visit.   Allergies  Allergen Reactions  . Erythromycin Nausea And Vomiting  . Nitrofurantoin Palpitations   Family History  Problem Relation Age of Onset  . Cancer Mother   . Breast cancer Mother   .  Heart disease Father   . Diabetes Maternal Grandfather   . Thyroid disease Paternal Grandmother   . Colon cancer Paternal Grandmother   . Renal cancer Paternal Grandmother   . Glaucoma Brother   . Anxiety disorder Brother   . Hypercholesterolemia Brother   . Healthy Son   . Irritable bowel syndrome Son    + See HPI  PE: There were no vitals taken for this visit. Wt Readings from Last 3 Encounters:  03/03/21 165 lb (74.8 kg)  02/18/21 165 lb 1.6 oz (74.9 kg)  11/13/20 166 lb (75.3 kg)   Constitutional: Normal weight, in NAD Eyes: PERRLA, EOMI, no exophthalmos ENT: moist mucous membranes, + slight symmetric thyromegaly, no cervical lymphadenopathy Cardiovascular: RRR, No MRG Respiratory: CTA B Gastrointestinal: abdomen soft, NT, ND, BS+ Musculoskeletal: no deformities, strength intact in all 4 Skin: moist, warm, no rashes Neurological: no tremor with outstretched hands, DTR normal in all 4  ASSESSMENT: 1.  Hashimoto's hypothyroidism  2.  History of thyroid nodules  PLAN:  1. Patient with long history of hypothyroidism, on  stable dose of levothyroxine.   -Her TFTs remain controlled but TPO and ATA antibodies were elevated at last visit and I suggested to start selenium 200 mcg daily to hopefully improve them -she is now on this dose. - latest thyroid labs reviewed with pt >> normal: Lab Results  Component Value Date   TSH 0.87 10/10/2020   - she continues on LT4 100 mcg daily -At this visit, she describes that her mental fog and fatigue are slightly better. She is also perimenopausal, which can contribute to the symptoms. - we discussed about taking the thyroid hormone every day, with water, >30 minutes before breakfast, separated by >4 hours from acid reflux medications, calcium, iron, multivitamins. Pt. is not taking it correctly: She added Dexilant only 30 minutes after levothyroxine.  We discussed about the importance of taking it at least 3 hours later.  I advised her that if she cannot separate them by this much in the morning, she would probably need to move Dexilant before lunch.  She mentions that her recent EGD was normal, but she still has some reflux, but much improved compared to before her gallbladder surgery. - will check thyroid tests in 1.5 month: TSH and fT4 and also her thyroid antibodies - OTW, I will see her back in 6 months.  2.  History of thyroid nodules -She has thyroid enlargement after her COVID-19 diagnosis, which has improved towards the end of last year -She has a history of a dominant right mid thyroid nodule on her initial thyroid ultrasound from 02/2013, with 3 subsequent ultrasounds including last year only showing signs of inflammation, as expected in the setting of Hashimoto's thyroiditis, but no clear nodules -She describes no neck compression symptoms -She has no family history of thyroid cancer history of radiation therapy to head or neck -No further investigation is needed for this  Orders Placed This Encounter  Procedures  . TSH  . T4, free  . T3, free  . Thyroglobulin  antibody  . Thyroid peroxidase antibody    Carlus Pavlov, MD PhD [facility] Endocrinology

## 2021-04-10 NOTE — Telephone Encounter (Signed)
Patient made aware she can come by Monday to get packet.

## 2021-04-11 ENCOUNTER — Encounter (INDEPENDENT_AMBULATORY_CARE_PROVIDER_SITE_OTHER): Payer: Self-pay

## 2021-04-14 ENCOUNTER — Other Ambulatory Visit (HOSPITAL_COMMUNITY)
Admission: RE | Admit: 2021-04-14 | Discharge: 2021-04-14 | Disposition: A | Discharge status: Home / Self Care | Payer: BC Managed Care – PPO | Source: Ambulatory Visit | Attending: Urology | Admitting: Urology

## 2021-04-14 ENCOUNTER — Encounter (HOSPITAL_COMMUNITY)
Admission: RE | Admit: 2021-04-14 | Discharge: 2021-04-14 | Disposition: A | Discharge status: Home / Self Care | Payer: BC Managed Care – PPO | Source: Ambulatory Visit | Attending: Urology | Admitting: Urology

## 2021-04-14 ENCOUNTER — Other Ambulatory Visit (INDEPENDENT_AMBULATORY_CARE_PROVIDER_SITE_OTHER): Payer: Self-pay

## 2021-04-14 ENCOUNTER — Other Ambulatory Visit: Payer: Self-pay

## 2021-04-14 DIAGNOSIS — Z20822 Contact with and (suspected) exposure to covid-19: Secondary | ICD-10-CM | POA: Insufficient documentation

## 2021-04-14 DIAGNOSIS — N200 Calculus of kidney: Secondary | ICD-10-CM | POA: Diagnosis not present

## 2021-04-14 DIAGNOSIS — Z881 Allergy status to other antibiotic agents status: Secondary | ICD-10-CM | POA: Diagnosis not present

## 2021-04-14 DIAGNOSIS — Z888 Allergy status to other drugs, medicaments and biological substances status: Secondary | ICD-10-CM | POA: Diagnosis not present

## 2021-04-14 DIAGNOSIS — Z79890 Hormone replacement therapy (postmenopausal): Secondary | ICD-10-CM | POA: Diagnosis not present

## 2021-04-14 DIAGNOSIS — Z87442 Personal history of urinary calculi: Secondary | ICD-10-CM | POA: Diagnosis not present

## 2021-04-14 DIAGNOSIS — Z79899 Other long term (current) drug therapy: Secondary | ICD-10-CM | POA: Diagnosis not present

## 2021-04-14 DIAGNOSIS — Z01812 Encounter for preprocedural laboratory examination: Secondary | ICD-10-CM | POA: Insufficient documentation

## 2021-04-14 LAB — SARS CORONAVIRUS 2 (TAT 6-24 HRS): SARS Coronavirus 2: NEGATIVE

## 2021-04-14 LAB — PREGNANCY, URINE: Preg Test, Ur: NEGATIVE

## 2021-04-15 ENCOUNTER — Ambulatory Visit (HOSPITAL_COMMUNITY)
Admission: RE | Admit: 2021-04-15 | Discharge: 2021-04-15 | Disposition: A | Discharge status: Home / Self Care | Payer: BC Managed Care – PPO | Attending: Urology | Admitting: Urology

## 2021-04-15 ENCOUNTER — Encounter (HOSPITAL_COMMUNITY)
Admission: RE | Disposition: A | Discharge status: Home / Self Care | Payer: Self-pay | Source: Home / Self Care | Attending: Urology

## 2021-04-15 ENCOUNTER — Ambulatory Visit (HOSPITAL_COMMUNITY)
Admission: RE | Admit: 2021-04-15 | Discharge: 2021-04-15 | Disposition: A | Discharge status: Home / Self Care | Payer: BC Managed Care – PPO | Source: Home / Self Care | Attending: Urology | Admitting: Urology

## 2021-04-15 ENCOUNTER — Other Ambulatory Visit (INDEPENDENT_AMBULATORY_CARE_PROVIDER_SITE_OTHER): Payer: Self-pay | Admitting: *Deleted

## 2021-04-15 ENCOUNTER — Encounter (HOSPITAL_COMMUNITY): Payer: Self-pay | Admitting: Urology

## 2021-04-15 DIAGNOSIS — N200 Calculus of kidney: Secondary | ICD-10-CM | POA: Insufficient documentation

## 2021-04-15 DIAGNOSIS — Z20822 Contact with and (suspected) exposure to covid-19: Secondary | ICD-10-CM | POA: Insufficient documentation

## 2021-04-15 DIAGNOSIS — K219 Gastro-esophageal reflux disease without esophagitis: Secondary | ICD-10-CM

## 2021-04-15 DIAGNOSIS — Z888 Allergy status to other drugs, medicaments and biological substances status: Secondary | ICD-10-CM | POA: Insufficient documentation

## 2021-04-15 DIAGNOSIS — Z79890 Hormone replacement therapy (postmenopausal): Secondary | ICD-10-CM | POA: Insufficient documentation

## 2021-04-15 DIAGNOSIS — Z79899 Other long term (current) drug therapy: Secondary | ICD-10-CM | POA: Insufficient documentation

## 2021-04-15 DIAGNOSIS — Z87442 Personal history of urinary calculi: Secondary | ICD-10-CM | POA: Insufficient documentation

## 2021-04-15 DIAGNOSIS — Z881 Allergy status to other antibiotic agents status: Secondary | ICD-10-CM | POA: Insufficient documentation

## 2021-04-15 HISTORY — PX: EXTRACORPOREAL SHOCK WAVE LITHOTRIPSY: SHX1557

## 2021-04-15 SURGERY — LITHOTRIPSY, ESWL
Anesthesia: LOCAL | Laterality: Left

## 2021-04-15 MED ORDER — TAMSULOSIN HCL 0.4 MG PO CAPS
0.4000 mg | ORAL_CAPSULE | Freq: Every day | ORAL | 0 refills | Status: DC
Start: 2021-04-15 — End: 2021-12-25

## 2021-04-15 MED ORDER — OXYCODONE-ACETAMINOPHEN 5-325 MG PO TABS: 1.0000 | ORAL_TABLET | ORAL | 0 refills | Status: AC | PRN

## 2021-04-15 MED ORDER — DIAZEPAM 5 MG PO TABS
10.0000 mg | ORAL_TABLET | Freq: Once | ORAL | Status: AC
  Administered 2021-04-15: 10 mg via ORAL
  Filled 2021-04-15: qty 2

## 2021-04-15 MED ORDER — ONDANSETRON HCL 4 MG PO TABS: 4.0000 mg | ORAL_TABLET | Freq: Every day | ORAL | 1 refills | Status: AC | PRN

## 2021-04-15 MED ORDER — SODIUM CHLORIDE 0.9 % IV SOLN
Freq: Once | INTRAVENOUS | Status: AC
  Administered 2021-04-15: 1000 mL via INTRAVENOUS

## 2021-04-15 MED ORDER — DEXLANSOPRAZOLE 30 MG PO CPDR
30.0000 mg | DELAYED_RELEASE_CAPSULE | Freq: Every day | ORAL | 5 refills | Status: DC
Start: 2021-04-15 — End: 2021-12-25

## 2021-04-15 MED ORDER — DIPHENHYDRAMINE HCL 25 MG PO CAPS
25.0000 mg | ORAL_CAPSULE | ORAL | Status: AC
Start: 2021-04-15 — End: 2021-04-15
  Administered 2021-04-15: 25 mg via ORAL
  Filled 2021-04-15: qty 1

## 2021-04-15 NOTE — Discharge Instructions (Signed)

## 2021-04-15 NOTE — Progress Notes (Signed)
Patient verbalized to nurse on litho truck that she took motrin Sunday at 11:00am. Patient brought to phase II area to wait until 11am and will then have her procedure. Husband in lobby and updated.

## 2021-04-15 NOTE — Interval H&P Note (Signed)
History and Physical Interval Note:  04/15/2021 8:45 AM  Sabrina [name]  has presented today for surgery, with the diagnosis of left renal calculus.  The various methods of treatment have been discussed with the patient and family. After consideration of risks, benefits and other options for treatment, the patient has consented to  Procedure(s): EXTRACORPOREAL SHOCK WAVE LITHOTRIPSY (ESWL) (Left) as a surgical intervention.  The patient's history has been reviewed, patient examined, no change in status, stable for surgery.  I have reviewed the patient's chart and labs.  Questions were answered to the patient's satisfaction.     Wilkie Aye

## 2021-04-16 ENCOUNTER — Encounter (HOSPITAL_COMMUNITY): Payer: Self-pay | Admitting: Urology

## 2021-04-22 ENCOUNTER — Other Ambulatory Visit: Payer: Self-pay

## 2021-04-22 DIAGNOSIS — N200 Calculus of kidney: Secondary | ICD-10-CM

## 2021-04-25 ENCOUNTER — Other Ambulatory Visit: Payer: Self-pay

## 2021-04-25 ENCOUNTER — Ambulatory Visit (HOSPITAL_COMMUNITY)
Admission: RE | Admit: 2021-04-25 | Discharge: 2021-04-25 | Disposition: A | Discharge status: Home / Self Care | Payer: BC Managed Care – PPO | Source: Ambulatory Visit | Attending: Urology | Admitting: Urology

## 2021-04-25 DIAGNOSIS — N200 Calculus of kidney: Secondary | ICD-10-CM | POA: Diagnosis not present

## 2021-04-29 ENCOUNTER — Telehealth (INDEPENDENT_AMBULATORY_CARE_PROVIDER_SITE_OTHER): Payer: BC Managed Care – PPO | Admitting: Urology

## 2021-04-29 ENCOUNTER — Other Ambulatory Visit: Payer: Self-pay

## 2021-04-29 ENCOUNTER — Encounter: Payer: Self-pay | Admitting: Urology

## 2021-04-29 DIAGNOSIS — N200 Calculus of kidney: Secondary | ICD-10-CM

## 2021-04-29 NOTE — Progress Notes (Signed)
04/29/2021 1:45 PM   Sabrina [name] 07-11-1971 [id number]  Referring provider: Maximiano Coss, MD [facility] [address] [place],  [place] [address]  Patient location: home Physician location: office I connected with  Lindwood Qua on 04/29/21 by a video enabled telemedicine application and verified that I am speaking with the correct person using two identifiers.   I discussed the limitations of evaluation and management by telemedicine. The patient expressed understanding and agreed to proceed.   Followup nephrolithiasis  HPI: Ms Sabrina [name] is a 50yo here for followup for nephrolithiasis. She passed numerous fragments and dropped them off Friday. KUB from 5/29 does not show any calculi. She has intermittent left flank pain that is mild.    PMH: Past Medical History:  Diagnosis Date  . Anxiety   . GERD (gastroesophageal reflux disease)   . History of hiatal hernia   . History of kidney stones   . Hypothyroidism   . Hypothyroidism   . PONV (postoperative nausea and vomiting)     Surgical History: Past Surgical History:  Procedure Laterality Date  . BIOPSY  03/05/2021   Procedure: BIOPSY;  Surgeon: Malissa Hippo, MD;  [facility];  Service: Endoscopy;;  bx of gastric polyps  . CESAREAN SECTION  2011  . CHOLECYSTECTOMY N/A 09/13/2020   Procedure: LAPAROSCOPIC CHOLECYSTECTOMY;  Surgeon: Franky Macho, MD;  Location: [facility];  Service: General;  Laterality: N/A;  . ESOPHAGOGASTRODUODENOSCOPY (EGD) WITH PROPOFOL N/A 03/05/2021   Procedure: ESOPHAGOGASTRODUODENOSCOPY (EGD) WITH PROPOFOL;  Surgeon: Malissa Hippo, MD;  [facility];  Service: Endoscopy;  Laterality: N/A;  AM  . EXTRACORPOREAL SHOCK WAVE LITHOTRIPSY Left 04/15/2021   Procedure: EXTRACORPOREAL SHOCK WAVE LITHOTRIPSY (ESWL);  Surgeon: Malen Gauze, MD;  Location: [facility];  Service: Urology;  Laterality: Left;  . HERNIA REPAIR  2015  . LASIK   2019  . LITHOTRIPSY  2017  . NASAL SINUS SURGERY     x2  . TUBAL LIGATION      Home Medications:  Allergies as of 04/29/2021      Reactions   Erythromycin Nausea And Vomiting   Nitrofurantoin Palpitations      Medication List       Accurate as of Apr 29, 2021  1:45 PM. If you have any questions, ask your nurse or doctor.        busPIRone 7.5 MG tablet Commonly known as: BUSPAR Take 7.5 mg by mouth 2 (two) times daily.   dexlansoprazole 60 MG capsule Commonly known as: DEXILANT Take 1 capsule (60 mg total) by mouth daily before breakfast.   Dexlansoprazole 30 MG capsule Commonly known as: Dexilant Take 1 capsule (30 mg total) by mouth daily.   eletriptan 20 MG tablet Commonly known as: RELPAX Take 20 mg by mouth as needed for migraine or headache. May repeat in 2 hours if headache persists or recurs.   FLUoxetine 10 MG capsule Commonly known as: PROZAC Take 10 mg by mouth daily.   fluticasone 50 MCG/ACT nasal spray Commonly known as: FLONASE Place into both nostrils daily.   levocetirizine 5 MG tablet Commonly known as: XYZAL Take 5 mg by mouth every evening.   levothyroxine 100 MCG tablet Commonly known as: SYNTHROID Take 100 mcg by mouth daily before breakfast.   ondansetron 4 MG tablet Commonly known as: Zofran Take 1 tablet (4 mg total) by mouth daily as needed for nausea or vomiting.   oxyCODONE-acetaminophen 5-325 MG tablet  Commonly known as: Percocet Take 1 tablet by mouth every 4 (four) hours as needed.   Selenium 200 MCG Caps Take 200 mcg by mouth daily.   tamsulosin 0.4 MG Caps capsule Commonly known as: Flomax Take 1 capsule (0.4 mg total) by mouth daily after supper.   valACYclovir 500 MG tablet Commonly known as: VALTREX Take 500 mg by mouth daily as needed (outbreak).       Allergies:  Allergies  Allergen Reactions  . Erythromycin Nausea And Vomiting  . Nitrofurantoin Palpitations    Family History: Family History   Problem Relation Age of Onset  . Cancer Mother   . Breast cancer Mother   . Heart disease Father   . Diabetes Maternal Grandfather   . Thyroid disease Paternal Grandmother   . Colon cancer Paternal Grandmother   . Renal cancer Paternal Grandmother   . Glaucoma Brother   . Anxiety disorder Brother   . Hypercholesterolemia Brother   . Healthy Son   . Irritable bowel syndrome Son     Social History:  reports that she has never smoked. She has never used smokeless tobacco. She reports that she does not drink alcohol and does not use drugs.  ROS: All other review of systems were reviewed and are negative except what is noted above in HPI  Physical Exam: There were no vitals taken for this visit.  Constitutional:  Alert and oriented, No acute distress. HEENT:  AT, moist mucus membranes.  Trachea midline, no masses. Cardiovascular: No clubbing, cyanosis, or edema. Respiratory: Normal respiratory effort, no increased work of breathing. GI: Abdomen is soft, nontender, nondistended, no abdominal masses GU: No CVA tenderness.  Lymph: No cervical or inguinal lymphadenopathy. Skin: No rashes, bruises or suspicious lesions. Neurologic: Grossly intact, no focal deficits, moving all 4 extremities. Psychiatric: Normal mood and affect.  Laboratory Data: Lab Results  Component Value Date   WBC 8.9 08/19/2010   HGB 9.5 (L) 08/19/2010   HCT 28.2 (L) 08/19/2010   MCV 87.5 08/19/2010   PLT 165 08/19/2010    Lab Results  Component Value Date   CREATININE 0.61 08/13/2010    No results found for: PSA  No results found for: TESTOSTERONE  No results found for: HGBA1C  Urinalysis    Component Value Date/Time   COLORURINE YELLOW 08/13/2010 1617   APPEARANCEUR Clear 11/13/2020 1014   LABSPEC 1.015 08/13/2010 1617   PHURINE 7.5 08/13/2010 1617   GLUCOSEU Negative 11/13/2020 1014   HGBUR NEGATIVE 08/13/2010 1617   BILIRUBINUR Negative 11/13/2020 1014   KETONESUR NEGATIVE 08/13/2010  1617   PROTEINUR Negative 11/13/2020 1014   PROTEINUR NEGATIVE 08/13/2010 1617   UROBILINOGEN 0.2 08/13/2010 1617   NITRITE Negative 11/13/2020 1014   NITRITE NEGATIVE 08/13/2010 1617   LEUKOCYTESUR Negative 11/13/2020 1014    Lab Results  Component Value Date   LABMICR See below: 11/13/2020   WBCUA 0-5 11/13/2020   LABEPIT 0-10 11/13/2020   BACTERIA Few (A) 11/13/2020    Pertinent Imaging: KUB 04/27/2021: Images reviewed and discussed with the patient Results for orders placed in visit on 04/25/21  DG Abd 1 View  Narrative CLINICAL DATA:  Intermittent flank pain, history kidney stones, recent lithotripsy  EXAM: ABDOMEN - 1 VIEW  COMPARISON:  By 2536  FINDINGS: Tubal ligation clips noted, one in RIGHT pelvis, second in RIGHT mid abdomen.  Surgical clips RIGHT upper quadrant consistent with cholecystectomy.  Nonobstructive bowel gas pattern with scattered stool throughout colon.  No definite urinary tract calcifications identified.  IMPRESSION: No acute abnormalities.   Electronically Signed By: Ulyses Southward M.D. On: 04/27/2021 11:45  No results found for this or any previous visit.  No results found for this or any previous visit.  No results found for this or any previous visit.  Results for orders placed during the hospital encounter of 03/17/21  Ultrasound renal complete  Narrative CLINICAL DATA:  Nephrolithiasis.  EXAM: RENAL / URINARY TRACT ULTRASOUND COMPLETE  COMPARISON:  No prior.  FINDINGS: Right Kidney:  Renal measurements: 11.1 x 5.1 x 5.6 cm = volume: 165.3 mL. Echogenicity within normal limits. No mass. Minimal prominence of the renal pelvis, possibly secondary to a extrarenal pelvis. No calyceal dilatation.  Left Kidney:  Renal measurements: 10.8 x 4.7 x 5.3 cm = volume: 141.3 mL. Echogenicity within normal limits. No mass. Minimal prominence of the renal pelvis possibly secondary to extrarenal pelvis. No calyceal  dilatation. Multiple nonobstructing renal calyceal stones, largest measuring 11 mm.  Bladder:  Appears normal for degree of bladder distention. Bilateral ureteral jets visualized.  Other:  None.  IMPRESSION: 1. Prominence of the renal pelvis noted bilaterally. This may be secondary to extrarenal pelves. No calyceal dilatation. Bilateral ureteral jets noted.  2. Multiple nonobstructing left renal calyceal stones, largest measures 11 mm.   Electronically Signed By: Maisie Fus  Register On: 03/18/2021 07:44  No results found for this or any previous visit.  No results found for this or any previous visit.  No results found for this or any previous visit.   Assessment & Plan:    1. Kidney stones -RTC 4 weeks with renal US   No follow-ups on file.  Wilkie Aye, MD  [facility] [place]

## 2021-04-29 NOTE — Patient Instructions (Signed)

## 2021-04-30 ENCOUNTER — Ambulatory Visit: Payer: BC Managed Care – PPO | Admitting: Urology

## 2021-05-01 LAB — CALCULI, WITH PHOTOGRAPH (CLINICAL LAB)
Calcium Oxalate Dihydrate: 30 %
Calcium Oxalate Monohydrate: 70 %
Weight Calculi: 72 mg

## 2021-05-15 ENCOUNTER — Other Ambulatory Visit: Payer: Self-pay | Admitting: Internal Medicine

## 2021-05-16 LAB — T4, FREE: Free T4: 1.33 ng/dL (ref 0.82–1.77)

## 2021-05-16 LAB — T3, FREE: T3, Free: 2.6 pg/mL (ref 2.0–4.4)

## 2021-05-16 LAB — THYROID PEROXIDASE ANTIBODY: Thyroperoxidase Ab SerPl-aCnc: 128 IU/mL — ABNORMAL HIGH (ref 0–34)

## 2021-05-16 LAB — TSH: TSH: 1.03 u[IU]/mL (ref 0.450–4.500)

## 2021-05-16 LAB — THYROGLOBULIN ANTIBODY: Thyroglobulin Antibody: 1.4 IU/mL — ABNORMAL HIGH (ref 0.0–0.9)

## 2021-05-22 ENCOUNTER — Other Ambulatory Visit: Payer: BC Managed Care – PPO

## 2021-06-04 ENCOUNTER — Ambulatory Visit (HOSPITAL_COMMUNITY): Payer: BC Managed Care – PPO

## 2021-06-11 ENCOUNTER — Ambulatory Visit: Payer: BC Managed Care – PPO | Admitting: Urology

## 2021-06-20 ENCOUNTER — Other Ambulatory Visit: Payer: Self-pay

## 2021-06-20 ENCOUNTER — Ambulatory Visit (HOSPITAL_COMMUNITY)
Admission: RE | Admit: 2021-06-20 | Discharge: 2021-06-20 | Disposition: A | Discharge status: Home / Self Care | Payer: BC Managed Care – PPO | Source: Ambulatory Visit | Attending: Urology | Admitting: Urology

## 2021-06-20 DIAGNOSIS — N200 Calculus of kidney: Secondary | ICD-10-CM | POA: Diagnosis present

## 2021-06-24 ENCOUNTER — Telehealth (INDEPENDENT_AMBULATORY_CARE_PROVIDER_SITE_OTHER): Payer: BC Managed Care – PPO | Admitting: Urology

## 2021-06-24 ENCOUNTER — Other Ambulatory Visit: Payer: Self-pay

## 2021-06-24 ENCOUNTER — Encounter: Payer: Self-pay | Admitting: Urology

## 2021-06-24 DIAGNOSIS — N200 Calculus of kidney: Secondary | ICD-10-CM | POA: Diagnosis not present

## 2021-06-24 NOTE — Progress Notes (Signed)
06/24/2021 3:30 PM   Sabrina [name] 19-Jun-1971 [id number]  Referring provider: Maximiano Coss, MD [facility] [address] [place],  [place] [address]  Patient location: home Physician location: office I connected with  Sabrina [name] on 06/24/21 by a video enabled telemedicine application and verified that I am speaking with the correct person using two identifiers.   I discussed the limitations of evaluation and management by telemedicine. The patient expressed understanding and agreed to proceed.    Followup nephrolithiasis  HPI: Mr Kosek is a 50yo here for followup for nephrolithiasis. No flank pain. Renal US was normal.No complaints today. She drinks 40oz of water daily.    PMH: Past Medical History:  Diagnosis Date   Anxiety    GERD (gastroesophageal reflux disease)    History of hiatal hernia    History of kidney stones    Hypothyroidism    Hypothyroidism    PONV (postoperative nausea and vomiting)     Surgical History: Past Surgical History:  Procedure Laterality Date   BIOPSY  03/05/2021   Procedure: BIOPSY;  Surgeon: Malissa Hippo, MD;  [facility]: [facility];  Service: Endoscopy;;  bx of gastric polyps   CESAREAN SECTION  2011   CHOLECYSTECTOMY N/A 09/13/2020   Procedure: LAPAROSCOPIC CHOLECYSTECTOMY;  Surgeon: Franky Macho, MD;  Location: [facility];  Service: General;  Laterality: N/A;   ESOPHAGOGASTRODUODENOSCOPY (EGD) WITH PROPOFOL N/A 03/05/2021   Procedure: ESOPHAGOGASTRODUODENOSCOPY (EGD) WITH PROPOFOL;  Surgeon: Malissa Hippo, MD;  Location: [facility];  Service: Endoscopy;  Laterality: N/A;  AM   EXTRACORPOREAL SHOCK WAVE LITHOTRIPSY Left 04/15/2021   Procedure: EXTRACORPOREAL SHOCK WAVE LITHOTRIPSY (ESWL);  Surgeon: Malen Gauze, MD;  Location: [facility];  Service: Urology;  Laterality: Left;   HERNIA REPAIR  2015   LASIK  2019   LITHOTRIPSY  2017   NASAL SINUS SURGERY     x2   TUBAL LIGATION       Home Medications:  Allergies as of 06/24/2021       Reactions   Erythromycin Nausea And Vomiting   Nitrofurantoin Palpitations        Medication List        Accurate as of June 24, 2021  3:30 PM. If you have any questions, ask your nurse or doctor.          busPIRone 7.5 MG tablet Commonly known as: BUSPAR Take 7.5 mg by mouth 2 (two) times daily.   dexlansoprazole 60 MG capsule Commonly known as: DEXILANT Take 1 capsule (60 mg total) by mouth daily before breakfast.   Dexlansoprazole 30 MG capsule Commonly known as: Dexilant Take 1 capsule (30 mg total) by mouth daily.   eletriptan 20 MG tablet Commonly known as: RELPAX Take 20 mg by mouth as needed for migraine or headache. May repeat in 2 hours if headache persists or recurs.   FLUoxetine 10 MG capsule Commonly known as: PROZAC Take 10 mg by mouth daily.   fluticasone 50 MCG/ACT nasal spray Commonly known as: FLONASE Place into both nostrils daily.   levocetirizine 5 MG tablet Commonly known as: XYZAL Take 5 mg by mouth every evening.   levothyroxine 100 MCG tablet Commonly known as: SYNTHROID Take 100 mcg by mouth daily before breakfast.   ondansetron 4 MG tablet Commonly known as: Zofran Take 1 tablet (4 mg total) by mouth daily as needed for nausea or vomiting.   oxyCODONE-acetaminophen 5-325 MG tablet Commonly known as: Percocet  Take 1 tablet by mouth every 4 (four) hours as needed.   Selenium 200 MCG Caps Take 200 mcg by mouth daily.   tamsulosin 0.4 MG Caps capsule Commonly known as: Flomax Take 1 capsule (0.4 mg total) by mouth daily after supper.   valACYclovir 500 MG tablet Commonly known as: VALTREX Take 500 mg by mouth daily as needed (outbreak).        Allergies:  Allergies  Allergen Reactions   Erythromycin Nausea And Vomiting   Nitrofurantoin Palpitations    Family History: Family History  Problem Relation Age of Onset   Cancer Mother    Breast cancer Mother     Heart disease Father    Diabetes Maternal Grandfather    Thyroid disease Paternal Grandmother    Colon cancer Paternal Grandmother    Renal cancer Paternal Grandmother    Glaucoma Brother    Anxiety disorder Brother    Hypercholesterolemia Brother    Healthy Son    Irritable bowel syndrome Son     Social History:  reports that she has never smoked. She has never used smokeless tobacco. She reports that she does not drink alcohol and does not use drugs.  ROS: All other review of systems were reviewed and are negative except what is noted above in HPI   Laboratory Data: Lab Results  Component Value Date   WBC 8.9 08/19/2010   HGB 9.5 (L) 08/19/2010   HCT 28.2 (L) 08/19/2010   MCV 87.5 08/19/2010   PLT 165 08/19/2010    Lab Results  Component Value Date   CREATININE 0.61 08/13/2010    No results found for: PSA  No results found for: TESTOSTERONE  No results found for: HGBA1C  Urinalysis    Component Value Date/Time   COLORURINE YELLOW 08/13/2010 1617   APPEARANCEUR Clear 11/13/2020 1014   LABSPEC 1.015 08/13/2010 1617   PHURINE 7.5 08/13/2010 1617   GLUCOSEU Negative 11/13/2020 1014   HGBUR NEGATIVE 08/13/2010 1617   BILIRUBINUR Negative 11/13/2020 1014   KETONESUR NEGATIVE 08/13/2010 1617   PROTEINUR Negative 11/13/2020 1014   PROTEINUR NEGATIVE 08/13/2010 1617   UROBILINOGEN 0.2 08/13/2010 1617   NITRITE Negative 11/13/2020 1014   NITRITE NEGATIVE 08/13/2010 1617   LEUKOCYTESUR Negative 11/13/2020 1014    Lab Results  Component Value Date   LABMICR See below: 11/13/2020   WBCUA 0-5 11/13/2020   LABEPIT 0-10 11/13/2020   BACTERIA Few (A) 11/13/2020    Pertinent Imaging: Renal US 06/20/2021: Images reviewed and discussed with the patient Results for orders placed in visit on 04/25/21  DG Abd 1 View  Narrative CLINICAL DATA:  Intermittent flank pain, history kidney stones, recent lithotripsy  EXAM: ABDOMEN - 1 VIEW  COMPARISON:  By  [id number]  FINDINGS: Tubal ligation clips noted, one in RIGHT pelvis, second in RIGHT mid abdomen.  Surgical clips RIGHT upper quadrant consistent with cholecystectomy.  Nonobstructive bowel gas pattern with scattered stool throughout colon.  No definite urinary tract calcifications identified.  IMPRESSION: No acute abnormalities.   Electronically Signed By: Ulyses Southward M.D. On: 04/27/2021 11:45  No results found for this or any previous visit.  No results found for this or any previous visit.  No results found for this or any previous visit.  Results for orders placed during the hospital encounter of 06/20/21  Ultrasound renal complete  Narrative CLINICAL DATA:  Follow-up nephrolithiasis. History of prior lithotripsy on Apr 15, 2021  EXAM: RENAL / URINARY TRACT ULTRASOUND COMPLETE  COMPARISON:  March 17, 2021  FINDINGS: Right Kidney:  Renal measurements: 11.6 cm x 5.5 cm x 5.3 cm = volume: 175.7 mL. Echogenicity within normal limits. No mass or hydronephrosis visualized.  Left Kidney:  Renal measurements: 11.3 cm x 5.2 cm x 5.4 cm = volume: 165.4 mL. Echogenicity within normal limits. No mass or hydronephrosis visualized.  Bladder:  Appears normal for degree of bladder distention.  Other:  None.  IMPRESSION: Normal renal ultrasound.   Electronically Signed By: Aram Candela M.D. On: 06/22/2021 04:18  No results found for this or any previous visit.  No results found for this or any previous visit.  No results found for this or any previous visit.   Assessment & Plan:    1. Kidney stones -Dietary handout sent to MyChart -RTC 1 year with renal US   No follow-ups on file.  Wilkie Aye, MD  [facility] [place]

## 2021-06-24 NOTE — Patient Instructions (Signed)
Textbook of Natural Medicine (5th ed., pp. 1518-1527.e3). [place], [place]: Elsevier.">  Dietary Guidelines to Help Prevent Kidney Stones Kidney stones are deposits of minerals and salts that form inside your kidneys. Your risk of developing kidney stones may be greater depending on your diet, your lifestyle, the medicines you take, and whether you have certain medical conditions. Most people can lower their chances of developing kidney stones by following the instructions below. Your dietitian may give you more specific instructions depending on your overall health and the type of kidney stones youtend to develop. What are tips for following this plan? Reading food labels  Choose foods with "no salt added" or "low-salt" labels. Limit your salt (sodium) intake to less than 1,500 mg a day. Choose foods with calcium for each meal and snack. Try to eat about 300 mg of calcium at each meal. Foods that contain 200-500 mg of calcium a serving include: 8 oz (237 mL) of milk, calcium-fortifiednon-dairy milk, and calcium-fortifiedfruit juice. Calcium-fortified means that calcium has been added to these drinks. 8 oz (237 mL) of kefir, yogurt, and soy yogurt. 4 oz (114 g) of tofu. 1 oz (28 g) of cheese. 1 cup (150 g) of dried figs. 1 cup (91 g) of cooked broccoli. One 3 oz (85 g) can of sardines or mackerel. Most people need 1,000-1,500 mg of calcium a day. Talk to your dietitian abouthow much calcium is recommended for you. Shopping Buy plenty of fresh fruits and vegetables. Most people do not need to avoid fruits and vegetables, even if these foods contain nutrients that may contribute to kidney stones. When shopping for convenience foods, choose: Whole pieces of fruit. Pre-made salads with dressing on the side. Low-fat fruit and yogurt smoothies. Avoid buying frozen meals or prepared deli foods. These can be high in sodium. Look for foods with live cultures, such as yogurt and kefir. Choose high-fiber  grains, such as whole-wheat breads, oat bran, and wheat cereals. Cooking Do not add salt to food when cooking. Place a salt shaker on the table and allow each person to add his or her own salt to taste. Use vegetable protein, such as beans, textured vegetable protein (TVP), or tofu, instead of meat in pasta, casseroles, and soups. Meal planning Eat less salt, if told by your dietitian. To do this: Avoid eating processed or pre-made food. Avoid eating fast food. Eat less animal protein, including cheese, meat, poultry, or fish, if told by your dietitian. To do this: Limit the number of times you have meat, poultry, fish, or cheese each week. Eat a diet free of meat at least 2 days a week. Eat only one serving each day of meat, poultry, fish, or seafood. When you prepare animal protein, cut pieces into small portion sizes. For most meat and fish, one serving is about the size of the palm of your hand. Eat at least five servings of fresh fruits and vegetables each day. To do this: Keep fruits and vegetables on hand for snacks. Eat one piece of fruit or a handful of berries with breakfast. Have a salad and fruit at lunch. Have two kinds of vegetables at dinner. Limit foods that are high in a substance called oxalate. These include: Spinach (cooked), rhubarb, beets, sweet potatoes, and Swiss chard. Peanuts. Potato chips, french fries, and baked potatoes with skin on. Nuts and nut products. Chocolate. If you regularly take a diuretic medicine, make sure to eat at least 1 or 2 servings of fruits or vegetables that are   high in potassium each day. These include: Avocado. Banana. Orange, prune, carrot, or tomato juice. Baked potato. Cabbage. Beans and split peas. Lifestyle  Drink enough fluid to keep your urine pale yellow. This is the most important thing you can do. Spread your fluid intake throughout the day. If you drink alcohol: Limit how much you use to: 0-1 drink a day for women who  are not pregnant. 0-2 drinks a day for men. Be aware of how much alcohol is in your drink. In the [place] one drink equals one 12 oz bottle of beer (355 mL), one 5 oz glass of wine (148 mL), or one 1 oz glass of hard liquor (44 mL). Lose weight if told by your health care provider. Work with your dietitian to find an eating plan and weight loss strategies that work best for you.  General information Talk to your health care provider and dietitian about taking daily supplements. You may be told the following depending on your health and the cause of your kidney stones: Not to take supplements with vitamin C. To take a calcium supplement. To take a daily probiotic supplement. To take other supplements such as magnesium, fish oil, or vitamin B6. Take over-the-counter and prescription medicines only as told by your health care provider. These include supplements. What foods should I limit? Limit your intake of the following foods, or eat them as told by your dietitian. Vegetables Spinach. Rhubarb. Beets. Canned vegetables. Pickles. Olives. Baked potatoeswith skin. Grains Wheat bran. Baked goods. Salted crackers. Cereals high in sugar. Meats and other proteins Nuts. Nut butters. Large portions of meat, poultry, or fish. Salted, precooked,or cured meats, such as sausages, meat loaves, and hot dogs. Dairy Cheese. Beverages Regular soft drinks. Regular vegetable juice. Seasonings and condiments Seasoning blends with salt. Salad dressings. Soy sauce. Ketchup. Barbecue sauce. Other foods Canned soups. Canned pasta sauce. Casseroles. Pizza. Lasagna. Frozen meals.Potato chips. French fries. The items listed above may not be a complete list of foods and beverages you should limit. Contact a dietitian for more information. What foods should I avoid? Talk to your dietitian about specific foods you should avoid based on the typeof kidney stones you have and your overall health. Fruits Grapefruit. The  item listed above may not be a complete list of foods and beverages you should avoid. Contact a dietitian for more information. Summary Kidney stones are deposits of minerals and salts that form inside your kidneys. You can lower your risk of kidney stones by making changes to your diet. The most important thing you can do is drink enough fluid. Drink enough fluid to keep your urine pale yellow. Talk to your dietitian about how much calcium you should have each day, and eat less salt and animal protein as told by your dietitian. This information is not intended to replace advice given to you by your health care provider. Make sure you discuss any questions you have with your healthcare provider. Document Revised: 11/09/2019 Document Reviewed: 11/09/2019 Elsevier Patient Education  2022 [employer]vier Inc.  

## 2021-07-04 ENCOUNTER — Encounter (INDEPENDENT_AMBULATORY_CARE_PROVIDER_SITE_OTHER): Payer: Self-pay

## 2021-08-08 ENCOUNTER — Other Ambulatory Visit (INDEPENDENT_AMBULATORY_CARE_PROVIDER_SITE_OTHER): Payer: Self-pay | Admitting: *Deleted

## 2021-08-08 ENCOUNTER — Encounter (INDEPENDENT_AMBULATORY_CARE_PROVIDER_SITE_OTHER): Payer: Self-pay | Admitting: *Deleted

## 2021-09-03 ENCOUNTER — Telehealth (INDEPENDENT_AMBULATORY_CARE_PROVIDER_SITE_OTHER): Payer: Self-pay | Admitting: *Deleted

## 2021-09-03 NOTE — Telephone Encounter (Signed)
PA submitted through cover my meds for dexlansoprazole and it was approved from 09/03/21 -09/03/22. Pt was notified.

## 2021-10-16 ENCOUNTER — Ambulatory Visit: Payer: BC Managed Care – PPO | Admitting: Internal Medicine

## 2021-12-25 ENCOUNTER — Other Ambulatory Visit: Payer: Self-pay

## 2021-12-25 ENCOUNTER — Encounter: Payer: Self-pay | Admitting: Internal Medicine

## 2021-12-25 ENCOUNTER — Ambulatory Visit: Payer: BC Managed Care – PPO | Admitting: Internal Medicine

## 2021-12-25 VITALS — BP 118/74 | HR 79 | Ht 64.0 in | Wt 166.8 lb

## 2021-12-25 DIAGNOSIS — Z8639 Personal history of other endocrine, nutritional and metabolic disease: Secondary | ICD-10-CM | POA: Diagnosis not present

## 2021-12-25 DIAGNOSIS — E038 Other specified hypothyroidism: Secondary | ICD-10-CM

## 2021-12-25 DIAGNOSIS — E063 Autoimmune thyroiditis: Secondary | ICD-10-CM | POA: Diagnosis not present

## 2021-12-25 LAB — T4, FREE: Free T4: 1.12 ng/dL (ref 0.60–1.60)

## 2021-12-25 LAB — TSH: TSH: 0.45 u[IU]/mL (ref 0.35–5.50)

## 2021-12-25 NOTE — Patient Instructions (Signed)
  Please continue Levothyroxine 100 mcg daily.  Take the thyroid hormone every day, with water, at least 30 minutes before breakfast, separated by at least 4 hours from: - acid reflux medications - calcium - iron - multivitamins  Please stop at the lab.  Please come back for a follow-up appointment in 1 year. 

## 2021-12-25 NOTE — Progress Notes (Signed)
Patient ID: Sabrina [name], female   DOB: Oct 27, 1971, 51 y.o.   MRN: [id number]   This visit occurred during the SARS-CoV-2 public health emergency.  Safety protocols were in place, including screening questions prior to the visit, additional usage of staff PPE, and extensive cleaning of exam room while observing appropriate contact time as indicated for disinfecting solutions.   HPI  Sabrina [name] is a 51 y.o.-year-old female, initially referred by her PCP, Dr. Norval GableHungarland ([place] in [place]), returning for follow-up for Hashimoto's hypothyroidism.  She also has a history of thyroid nodule.  She previously saw several endocrinologists. She lives in [place].  She [name] Dr. Vincente PoliGrewal.  Last visit with me 6 months ago.  Interim history: At last visit, she was having kidney stones and had ESWL No kidney stones since then. She had the flu in 07/2021. Had Prednisone then, no recent steroids. At this visit, she is eating well, without mental fog, hot flashes, or significant weight changes.  Hypothyroidism: - dx'ed in 2014, during investigation for fatigue. At that time, she had thyroid imaging and was found to have thyroid nodules.  She is on levothyroxine 100 mcg: - fasting - with water - separated by >30 min from b'fast  - no calcium, iron - + multivitamins later in the day - + prev. on Protonix 40 mg daily at bedtime >> changed to Dexilant 30 min after LT4  (!) >> moved to bedtime at last visit >> now stopped - now on Prevacid at night On vit C, D, Zn.  I reviewed pt's thyroid tests: Lab Results  Component Value Date   TSH 1.030 05/15/2021   TSH 0.87 10/10/2020   FREET4 1.33 05/15/2021   FREET4 1.19 10/10/2020   T3FREE 2.6 05/15/2021  07/04/2020: TSH 1.27 03/21/2020: TSH 1.06, free T4 1.4 02/23/2020: TSH 1.34 (0.45-4.5), free T4 1.37, total T3 101 04/10/2014: TSH 0.86 (0.34-5.66), free T4 1.12 (0.52-1.21)  Antithyroid antibodies: Component     Latest Ref Rng & Units 10/10/2020  05/15/2021  Thyroglobulin Ab     < or = 1 IU/mL 2 (H)   Thyroperoxidase Ab SerPl-aCnc     0 - 34 IU/mL 86 (H) 128 (H)  Thyroglobulin Antibody     0.0 - 0.9 IU/mL  1.4 (H)  03/21/2020: TPO antibodies 169 02/23/2020: TPO antibodies 190 (0-34) No components found for: TPOAB   We started selenium 200 mcg daily in 09/2020.  She started to feel much better after starting this.  She previously described: - weight gain - fatigue - cold intolerance - depression - constipation - dry skin - hair loss The symptoms improved.  Thyroid nodule:  Reviewed previous investigation: Thyroid ultrasound (02/2013): 1.7 x 1.03 cm right mid thyroid nodule Thyroid ultrasound in office-Dr. Esclamado (08/2013): No obvious thyroid nodule Thyroid ultrasound (04/10/2014): Heterogeneous, thyroid lobes, without thyroid nodules.  Small lymph nodes, benign-appearing.  Thyroid ultrasound (02/20/2020): Heterogeneous thyroid with increased vascularity throughout the entire gland, no nodules  Pt denies: - feeling nodules in neck - dysphagia - choking - SOB with lying down She described increased fullness in her neck and also hoarseness after having had COVID-19 in 01/2020.  Interestingly, the fullness in her neck improved after gallbladder surgery 08/2020.  At this visit, she again describes fullness in her neck when lying on the right side.  She also has hoarseness, which she feels may be related to GERD.  She has + FH of thyroid disorders in: PGM. 3 great aunts with thyroid ds. No FH  of thyroid cancer.  No h/o radiation tx to head or neck. No recent use of iodine supplements.  She has a h/o 2 miscarriages. She had + ANA.  She saw rheumatology in the past.  ROS: + See HPI  Past Medical History:  Diagnosis Date   Anxiety    GERD (gastroesophageal reflux disease)    History of hiatal hernia    History of kidney stones    Hypothyroidism    Hypothyroidism    PONV (postoperative nausea and vomiting)    Past  Surgical History:  Procedure Laterality Date   BIOPSY  03/05/2021   Procedure: BIOPSY;  Surgeon: Malissa Hippoehman, Najeeb U, MD;  [facility];  Service: Endoscopy;;  bx of gastric polyps   CESAREAN SECTION  2011   CHOLECYSTECTOMY N/A 09/13/2020   Procedure: LAPAROSCOPIC CHOLECYSTECTOMY;  Surgeon: Franky MachoJenkins, Mark, MD;  Location: [facility];  Service: General;  Laterality: N/A;   ESOPHAGOGASTRODUODENOSCOPY (EGD) WITH PROPOFOL N/A 03/05/2021   Procedure: ESOPHAGOGASTRODUODENOSCOPY (EGD) WITH PROPOFOL;  Surgeon: Malissa Hippoehman, Najeeb U, MD;  [facility];  Service: Endoscopy;  Laterality: N/A;  AM   EXTRACORPOREAL SHOCK WAVE LITHOTRIPSY Left 04/15/2021   Procedure: EXTRACORPOREAL SHOCK WAVE LITHOTRIPSY (ESWL);  Surgeon: Malen GauzeMcKenzie, Patrick L, MD;  Location: [facility];  Service: Urology;  Laterality: Left;   HERNIA REPAIR  2015   LASIK  2019   LITHOTRIPSY  2017   NASAL SINUS SURGERY     x2   TUBAL LIGATION     Social History   Socioeconomic History   Marital status: Married    Spouse name: Not on file   Number of children: 2   Years of education: Not on file   Highest education level: Not on file  Occupational History   Occupation: Print production plannerffice manager  Tobacco Use   Smoking status: Never   Smokeless tobacco: Never  Substance and Sexual Activity   Alcohol use: Never   Drug use: Never   Sexual activity: Yes  Other Topics Concern   Not on file  Social History Narrative   ** Merged History Encounter **       She has 1 adopted child, 432 years old in 09/2020   Social Determinants of Health   Financial Resource Strain: Not on file  Food Insecurity: Not on file  Transportation Needs: Not on file  Physical Activity: Not on file  Stress: Not on file  Social Connections: Not on file  Intimate Partner Violence: Not on file   Current Outpatient Medications on File Prior to Visit  Medication Sig Dispense Refill   busPIRone (BUSPAR) 7.5 MG tablet Take 7.5 mg by mouth 2 (two) times daily.       Dexlansoprazole (DEXILANT) 30 MG capsule Take 1 capsule (30 mg total) by mouth daily. 30 capsule 5   eletriptan (RELPAX) 20 MG tablet Take 20 mg by mouth as needed for migraine or headache. May repeat in 2 hours if headache persists or recurs.     FLUoxetine (PROZAC) 10 MG capsule Take 10 mg by mouth daily.     fluticasone (FLONASE) 50 MCG/ACT nasal spray Place into both nostrils daily.     levocetirizine (XYZAL) 5 MG tablet Take 5 mg by mouth every evening.     levothyroxine (SYNTHROID) 100 MCG tablet Take 100 mcg by mouth daily before breakfast.      ondansetron (ZOFRAN) 4 MG tablet Take 1 tablet (4 mg total) by mouth daily as needed for nausea or vomiting. 30 tablet 1  oxyCODONE-acetaminophen (PERCOCET) 5-325 MG tablet Take 1 tablet by mouth every 4 (four) hours as needed. 30 tablet 0   Selenium 200 MCG CAPS Take 200 mcg by mouth daily.     tamsulosin (FLOMAX) 0.4 MG CAPS capsule Take 1 capsule (0.4 mg total) by mouth daily after supper. 30 capsule 0   valACYclovir (VALTREX) 500 MG tablet Take 500 mg by mouth daily as needed (outbreak).     No current facility-administered medications on file prior to visit.   Allergies  Allergen Reactions   Erythromycin Nausea And Vomiting   Nitrofurantoin Palpitations   Family History  Problem Relation Age of Onset   Cancer Mother    Breast cancer Mother    Heart disease Father    Diabetes Maternal Grandfather    Thyroid disease Paternal Grandmother    Colon cancer Paternal Grandmother    Renal cancer Paternal Grandmother    Glaucoma Brother    Anxiety disorder Brother    Hypercholesterolemia Brother    Healthy Son    Irritable bowel syndrome Son    + See HPI  PE: BP 118/74 (BP Location: Left Arm, Patient Position: Sitting, Cuff Size: Normal)    Pulse 79    Ht 5\' 4"  (1.626 m)    Wt 166 lb 12.8 oz (75.7 kg)    SpO2 98%    BMI 28.63 kg/m  Wt Readings from Last 3 Encounters:  12/25/21 166 lb 12.8 oz (75.7 kg)  04/10/21 164 lb 9.6 oz (74.7  kg)  03/03/21 165 lb (74.8 kg)   Constitutional: Normal weight, in NAD Eyes: PERRLA, EOMI, no exophthalmos ENT: moist mucous membranes, + slight symmetric thyromegaly, no cervical lymphadenopathy Cardiovascular: RRR, No MRG Respiratory: CTA B Musculoskeletal: no deformities, strength intact in all 4 Skin: moist, warm, no rashes Neurological: no tremor with outstretched hands, DTR normal in all 4  ASSESSMENT: 1.  Hashimoto's hypothyroidism  2.  History of thyroid nodules  PLAN:  1. Patient with long history of hypothyroidism, on stable dose of levothyroxine -Restarted selenium 200 mcg daily in the past to decrease her ATA and TPO antibodies - latest thyroid labs reviewed with pt. >> normal: Lab Results  Component Value Date   TSH 1.030 05/15/2021  - she continues on LT4 100 mcg daily - pt feels good on this dose.  Previously had mental fog and fatigue but these improved. - we discussed about taking the thyroid hormone every day, with water, >30 minutes before breakfast, separated by >4 hours from acid reflux medications, calcium, iron, multivitamins. Pt. is taking it correctly.  At last visit, she was taking Dexilant only 30 minutes after levothyroxine and I advised her to move it at least 3 hours later.  She did move it at night afterwards, but since last visit, she had to stop Dexilant due to lack of insurance coverage.  She is on Prevacid now.  She takes this at night. - will check thyroid tests today: TSH and fT4 - If labs are abnormal, she will need to return for repeat TFTs in 1.5 months - I will see her back in a year  2.  History of thyroid nodules -She has a history of thyroid enlargement after her COVID-19 diagnosis, which improved towards the end of 2021 -he has a history of a dominant right mid thyroid nodule on her initial thyroid ultrasound from 02/2013 and but she had 3 subsequent ultrasounds including one in 2021 showing only signs of inflammation, as expected in the  setting of  Hashimoto's thyroiditis, but no clear nodules.   -No neck compression symptoms, but she does feel mild pressure in her neck when she lies on the right side watching TV.  She also had some hoarseness which he attributes to acid reflux No family history of thyroid cancer or history of radiation therapy to head or neck -No further investigation is needed for this  Needs refills.  Component     Latest Ref Rng & Units 12/25/2021  TSH     0.35 - 5.50 uIU/mL 0.45  T4,Free(Direct)     0.60 - 1.60 ng/dL 7.74  Thyroglobulin Ab     < or = 1 IU/mL 1  Thyroperoxidase Ab SerPl-aCnc     <9 IU/mL 123 (H)  TFTs are normal.  Thyroglobulin antibodies are lower.  TPO antibodies are approximately stable.  Carlus Pavlov, MD PhD [facility]

## 2021-12-26 LAB — THYROGLOBULIN ANTIBODY: Thyroglobulin Ab: 1 IU/mL (ref <=1)

## 2021-12-26 LAB — THYROID PEROXIDASE ANTIBODY: Thyroperoxidase Ab SerPl-aCnc: 123 IU/mL — ABNORMAL HIGH (ref <9)

## 2021-12-26 MED ORDER — LEVOTHYROXINE SODIUM 100 MCG PO TABS: 100.0000 ug | ORAL_TABLET | Freq: Every day | ORAL | 3 refills | Status: DC

## 2022-06-09 ENCOUNTER — Ambulatory Visit (INDEPENDENT_AMBULATORY_CARE_PROVIDER_SITE_OTHER): Payer: BC Managed Care – PPO | Admitting: Gastroenterology

## 2022-06-19 ENCOUNTER — Ambulatory Visit (HOSPITAL_COMMUNITY)
Admission: RE | Admit: 2022-06-19 | Discharge: 2022-06-19 | Disposition: A | Discharge status: Home / Self Care | Payer: BC Managed Care – PPO | Source: Ambulatory Visit | Attending: Urology | Admitting: Urology

## 2022-06-19 DIAGNOSIS — N200 Calculus of kidney: Secondary | ICD-10-CM | POA: Diagnosis not present

## 2022-06-26 ENCOUNTER — Ambulatory Visit: Payer: BC Managed Care – PPO | Admitting: Urology

## 2022-07-03 ENCOUNTER — Ambulatory Visit: Payer: BC Managed Care – PPO | Admitting: Urology

## 2022-07-18 IMAGING — DX DG ABDOMEN 1V
1 series · 1 of 1 positions shown · non-contrast
Comparison: 03/27/2021

CLINICAL DATA: Left-sided lithotripsy preoperative evaluation,
history of bilateral renal calculi

EXAM:
ABDOMEN - 1 VIEW

[abdomen kub]
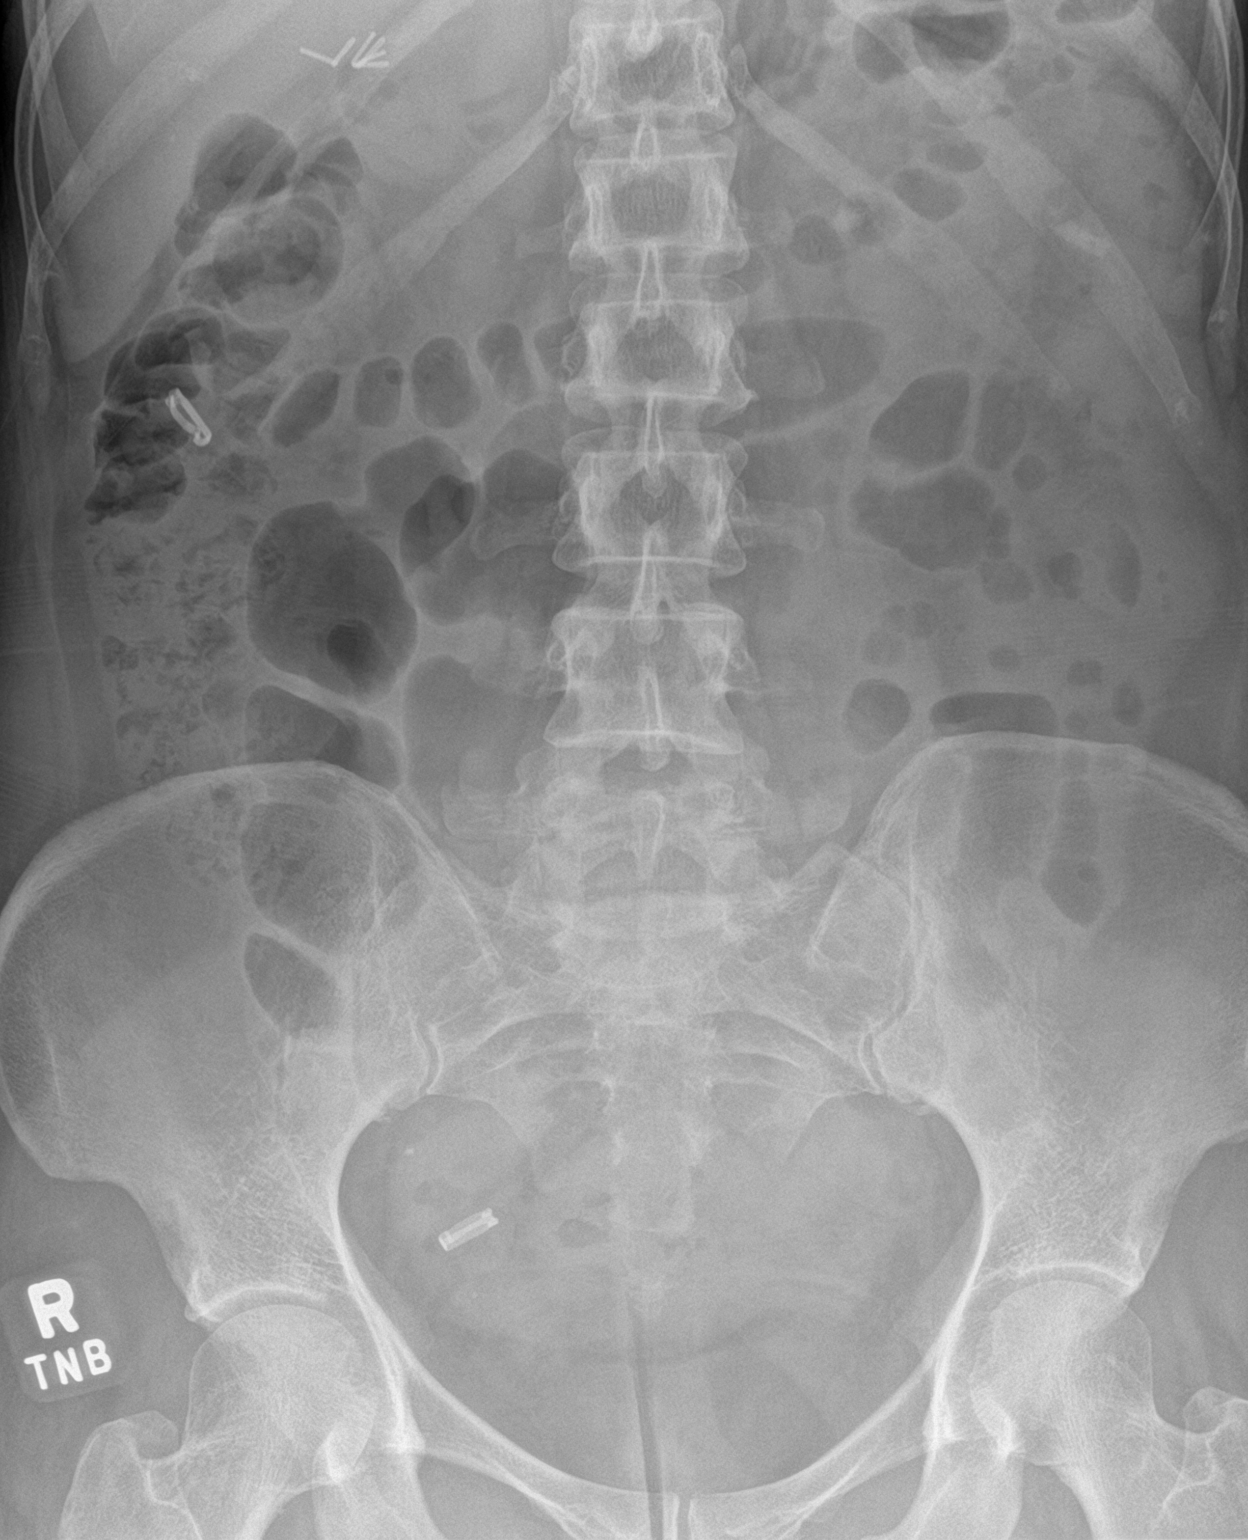

[1 of 1 positions shown; findings below may reference images not displayed]

FINDINGS: Supine frontal view of the abdomen and pelvis excludes the
hemidiaphragms by collimation. The rounded calcification overlying
the left renal silhouette is again identified, just inferior to the
left rib, measuring approximately 7 mm. No other urinary tract
calculi are identified. Bowel gas pattern is unremarkable. Stable
migration of tubal ligation clip into the right mid abdomen.
IMPRESSION: 1. Stable 7 mm left renal calculus.

## 2022-07-24 ENCOUNTER — Ambulatory Visit: Payer: BC Managed Care – PPO | Admitting: Urology

## 2022-07-24 VITALS — BP 107/68 | HR 71

## 2022-07-24 DIAGNOSIS — N200 Calculus of kidney: Secondary | ICD-10-CM | POA: Diagnosis not present

## 2022-07-24 LAB — MICROSCOPIC EXAMINATION
Bacteria, UA: NONE SEEN
Epithelial Cells (non renal): NONE SEEN /hpf (ref 0–10)
Renal Epithel, UA: NONE SEEN /hpf

## 2022-07-24 LAB — URINALYSIS, ROUTINE W REFLEX MICROSCOPIC
Bilirubin, UA: NEGATIVE
Glucose, UA: NEGATIVE
Ketones, UA: NEGATIVE
Leukocytes,UA: NEGATIVE
Nitrite, UA: NEGATIVE
Protein,UA: NEGATIVE
Specific Gravity, UA: 1.02 (ref 1.005–1.030)
Urobilinogen, Ur: 0.2 mg/dL (ref 0.2–1.0)
pH, UA: 5.5 (ref 5.0–7.5)

## 2022-07-24 NOTE — Progress Notes (Unsigned)
07/24/2022 11:51 AM   Sabrina [name] 10/12/1971 [id number]  Referring provider: Maximiano Coss, MD [facility] [address] [place],  [place] [address]  Followup nephrolithiasis   HPI: Sabrina [name] is a 51yo here for followup for nephrolithiasis. No stone events since last visit. She drinks 20oz of water.crystal light daily. She drinks 3 mountain dews a week. Renal US 7/22 shows no stones.    PMH: Past Medical History:  Diagnosis Date   Anxiety    GERD (gastroesophageal reflux disease)    History of hiatal hernia    History of kidney stones    Hypothyroidism    Hypothyroidism    PONV (postoperative nausea and vomiting)     Surgical History: Past Surgical History:  Procedure Laterality Date   BIOPSY  03/05/2021   Procedure: BIOPSY;  Surgeon: Malissa Hippo, MD;  [facility];  Service: Endoscopy;;  bx of gastric polyps   CESAREAN SECTION  2011   CHOLECYSTECTOMY N/A 09/13/2020   Procedure: LAPAROSCOPIC CHOLECYSTECTOMY;  Surgeon: Franky Macho, MD;  Location: [facility];  Service: General;  Laterality: N/A;   ESOPHAGOGASTRODUODENOSCOPY (EGD) WITH PROPOFOL N/A 03/05/2021   Procedure: ESOPHAGOGASTRODUODENOSCOPY (EGD) WITH PROPOFOL;  Surgeon: Malissa Hippo, MD;  [facility];  Service: Endoscopy;  Laterality: N/A;  AM   EXTRACORPOREAL SHOCK WAVE LITHOTRIPSY Left 04/15/2021   Procedure: EXTRACORPOREAL SHOCK WAVE LITHOTRIPSY (ESWL);  Surgeon: Malen Gauze, MD;  Location: [facility];  Service: Urology;  Laterality: Left;   HERNIA REPAIR  2015   LASIK  2019   LITHOTRIPSY  2017   NASAL SINUS SURGERY     x2   TUBAL LIGATION      Home Medications:  Allergies as of 07/24/2022       Reactions   Erythromycin Nausea And Vomiting   Nitrofurantoin Palpitations        Medication List        Accurate as of July 24, 2022 11:51 AM. If you have any questions, ask your nurse or doctor.          busPIRone 7.5 MG  tablet Commonly known as: BUSPAR Take 7.5 mg by mouth 2 (two) times daily.   docusate sodium 100 MG capsule Commonly known as: COLACE Take 100 mg by mouth 2 (two) times daily.   eletriptan 20 MG tablet Commonly known as: RELPAX Take 20 mg by mouth as needed for migraine or headache. May repeat in 2 hours if headache persists or recurs.   FLUoxetine 10 MG capsule Commonly known as: PROZAC Take 10 mg by mouth daily.   fluticasone 50 MCG/ACT nasal spray Commonly known as: FLONASE Place into both nostrils daily.   LANSOPRAZOLE PO Take 15 mg by mouth 2 (two) times daily.   levothyroxine 100 MCG tablet Commonly known as: SYNTHROID Take 1 tablet (100 mcg total) by mouth daily before breakfast.   Selenium 200 MCG Caps Take 200 mcg by mouth daily.   Super Calcium 600 + D3 600-10 MG-MCG Tabs Generic drug: Calcium Carb-Cholecalciferol Take by mouth.   valACYclovir 500 MG tablet Commonly known as: VALTREX Take 500 mg by mouth daily as needed (outbreak).        Allergies:  Allergies  Allergen Reactions   Erythromycin Nausea And Vomiting   Nitrofurantoin Palpitations    Family History: Family History  Problem Relation Age of Onset   Cancer Mother    Breast cancer Mother    Heart disease Father  Diabetes Maternal Grandfather    Thyroid disease Paternal Grandmother    Colon cancer Paternal Grandmother    Renal cancer Paternal Grandmother    Glaucoma Brother    Anxiety disorder Brother    Hypercholesterolemia Brother    Healthy Son    Irritable bowel syndrome Son     Social History:  reports that she has never smoked. She has never used smokeless tobacco. She reports that she does not drink alcohol and does not use drugs.  ROS: All other review of systems were reviewed and are negative except what is noted above in HPI  Physical Exam: BP 107/68   Pulse 71   Constitutional:  Alert and oriented, No acute distress. HEENT: [place] AT, moist mucus membranes.  Trachea  midline, no masses. Cardiovascular: No clubbing, cyanosis, or edema. Respiratory: Normal respiratory effort, no increased work of breathing. GI: Abdomen is soft, nontender, nondistended, no abdominal masses GU: No CVA tenderness.  Lymph: No cervical or inguinal lymphadenopathy. Skin: No rashes, bruises or suspicious lesions. Neurologic: Grossly intact, no focal deficits, moving all 4 extremities. Psychiatric: Normal mood and affect.  Laboratory Data: Lab Results  Component Value Date   WBC 8.9 08/19/2010   HGB 9.5 (L) 08/19/2010   HCT 28.2 (L) 08/19/2010   MCV 87.5 08/19/2010   PLT 165 08/19/2010    Lab Results  Component Value Date   CREATININE 0.61 08/13/2010    No results found for: "PSA"  No results found for: "TESTOSTERONE"  No results found for: "HGBA1C"  Urinalysis    Component Value Date/Time   COLORURINE YELLOW 08/13/2010 1617   APPEARANCEUR Clear 11/13/2020 1014   LABSPEC 1.015 08/13/2010 1617   PHURINE 7.5 08/13/2010 1617   GLUCOSEU Negative 11/13/2020 1014   HGBUR NEGATIVE 08/13/2010 1617   BILIRUBINUR Negative 11/13/2020 1014   KETONESUR NEGATIVE 08/13/2010 1617   PROTEINUR Negative 11/13/2020 1014   PROTEINUR NEGATIVE 08/13/2010 1617   UROBILINOGEN 0.2 08/13/2010 1617   NITRITE Negative 11/13/2020 1014   NITRITE NEGATIVE 08/13/2010 1617   LEUKOCYTESUR Negative 11/13/2020 1014    Lab Results  Component Value Date   LABMICR See below: 11/13/2020   WBCUA 0-5 11/13/2020   LABEPIT 0-10 11/13/2020   BACTERIA Few (A) 11/13/2020    Pertinent Imaging: Renal US 06/20/2022: Images reviewed and discussed with the patient  Results for orders placed in visit on 04/25/21  DG Abd 1 View  Narrative CLINICAL DATA:  Intermittent flank pain, history kidney stones, recent lithotripsy  EXAM: ABDOMEN - 1 VIEW  COMPARISON:  By [id number]  FINDINGS: Tubal ligation clips noted, one in RIGHT pelvis, second in RIGHT mid abdomen.  Surgical clips RIGHT upper  quadrant consistent with cholecystectomy.  Nonobstructive bowel gas pattern with scattered stool throughout colon.  No definite urinary tract calcifications identified.  IMPRESSION: No acute abnormalities.   Electronically Signed By: Ulyses Southward M.D. On: 04/27/2021 11:45  No results found for this or any previous visit.  No results found for this or any previous visit.  No results found for this or any previous visit.  Results for orders placed during the hospital encounter of 06/19/22  Ultrasound renal complete  Narrative CLINICAL DATA:  Reported history of kidney stones. Yearly follow-up. History of lithotripsy on 04/15/2021.  EXAM: RENAL / URINARY TRACT ULTRASOUND COMPLETE  COMPARISON:  06/20/2021  FINDINGS: Right Kidney:  Renal measurements: 5.6 x 4.6 x 5.2 cm = volume: 119.5 mL. Echogenicity within normal limits. No mass or hydronephrosis visualized.  Left Kidney:  Renal measurements: 9.5 x 5.0 x 5.6 cm = volume: 139.6 mL. Echogenicity within normal limits. No mass or hydronephrosis visualized.  Bladder:  Appears normal for degree of bladder distention.  Other:  None.  IMPRESSION: Normal renal ultrasound. No visualized intrarenal stones. No hydronephrosis.   Electronically Signed By: Amie Portland M.D. On: 06/20/2022 16:56  No results found for this or any previous visit.  No results found for this or any previous visit.  No results found for this or any previous visit.   Assessment & Plan:    1. Kidney stones -RTC 2 years with a renal US - Urinalysis, Routine w reflex microscopic   No follow-ups on file.  Wilkie Aye, MD  [facility] [place]

## 2022-07-28 IMAGING — DX DG ABDOMEN 1V
2 series · 2 of 2 positions shown · non-contrast
Comparison: By [id number]

CLINICAL DATA: Intermittent flank pain, history kidney stones,
recent lithotripsy

EXAM:
ABDOMEN - 1 VIEW

[abdomen kub (1 of 2)]
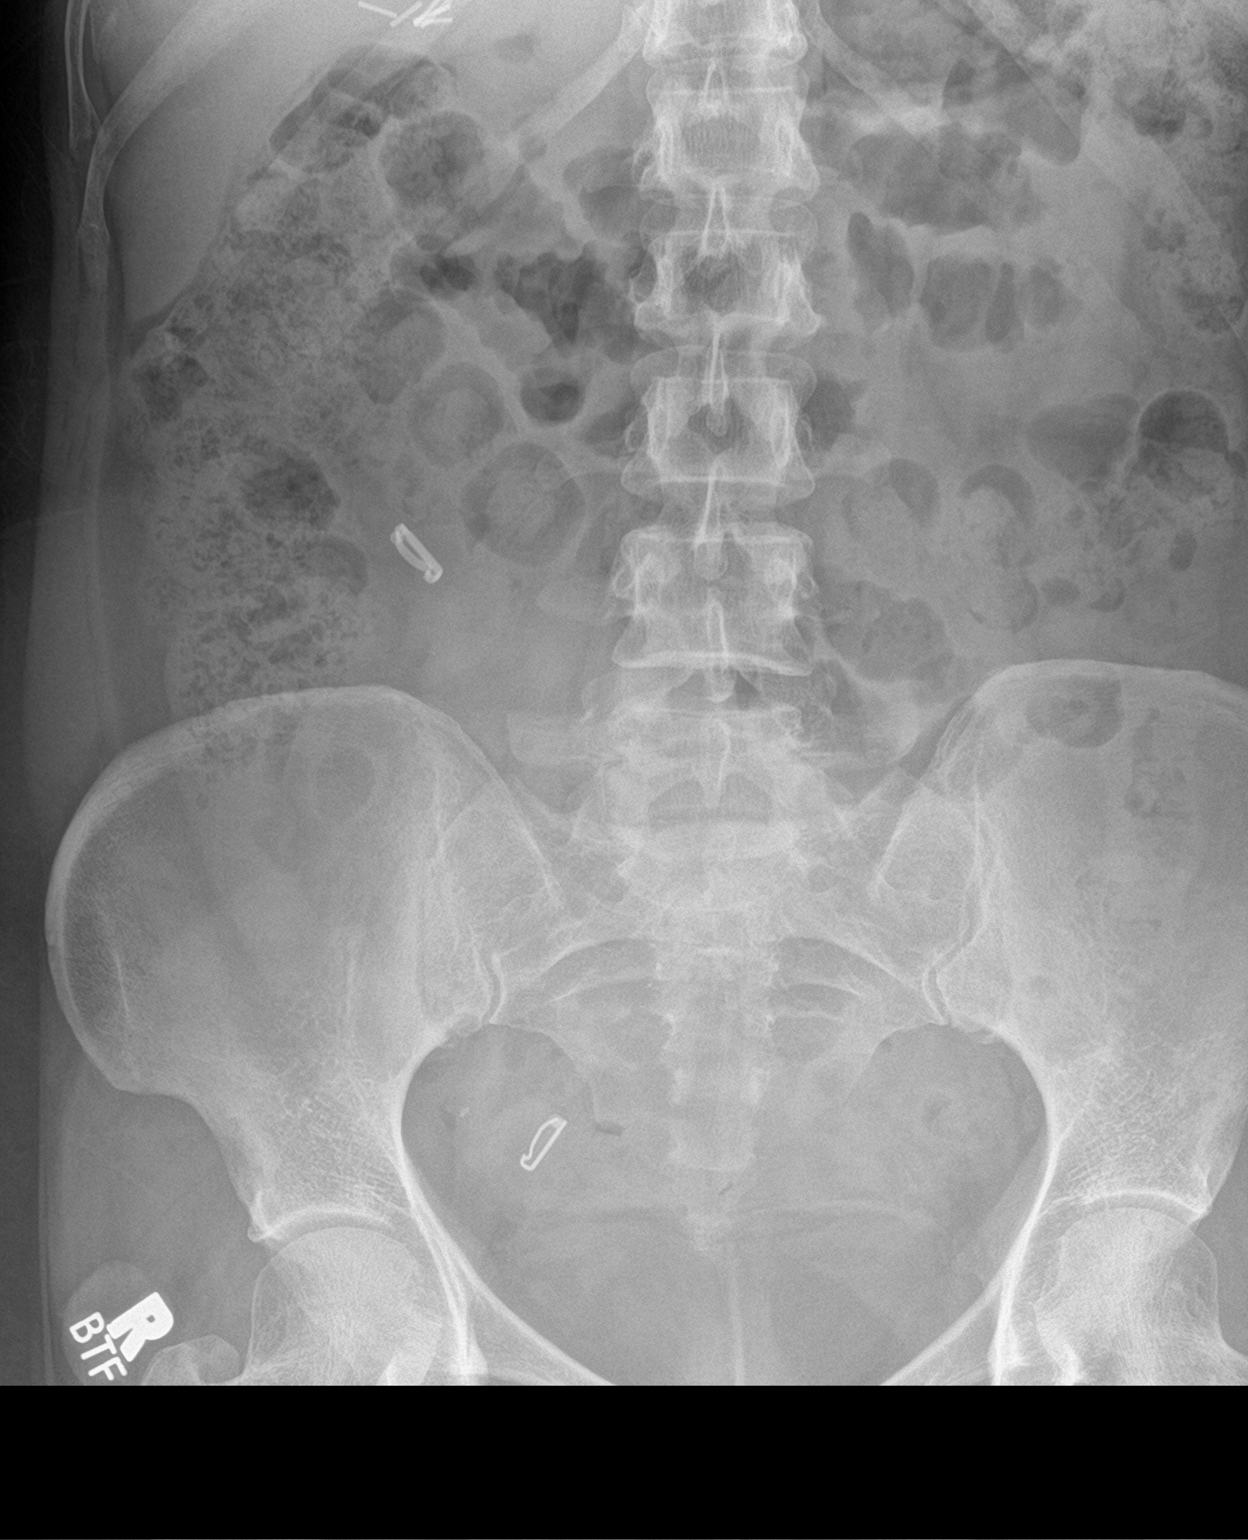

[abdomen kub (2 of 2)]
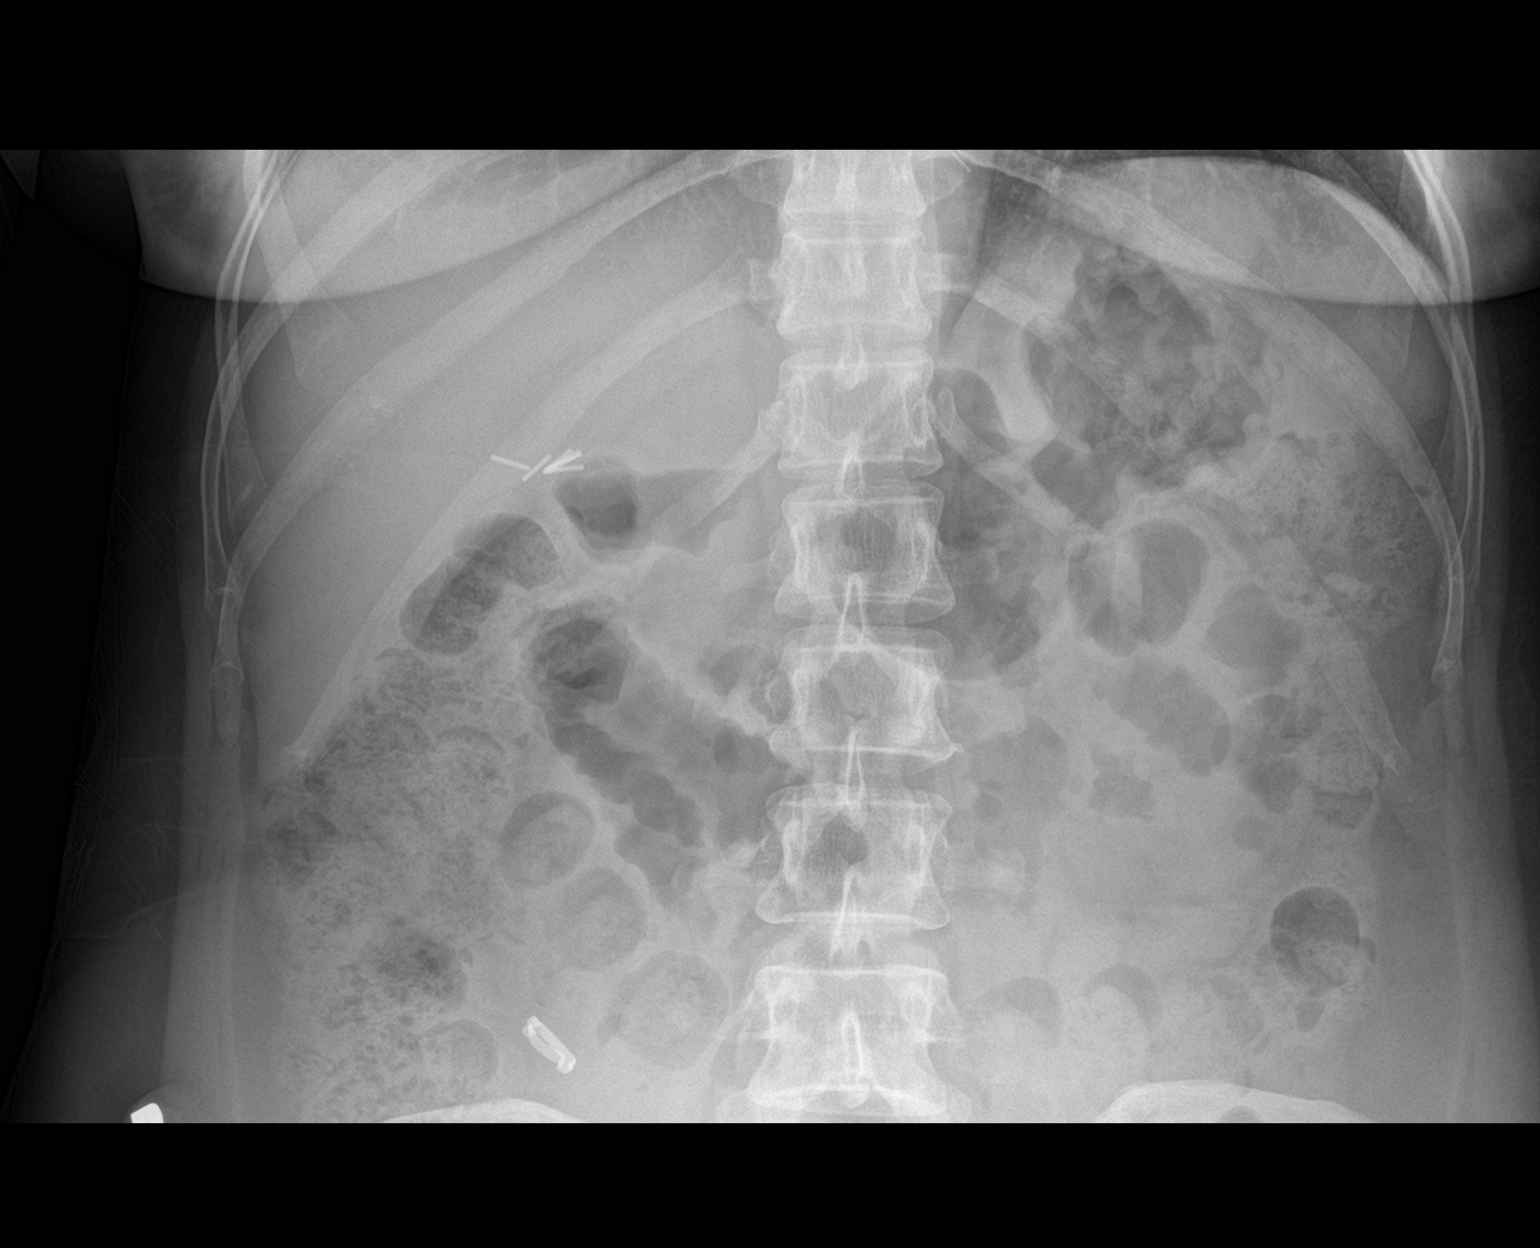

[2 of 2 positions shown; findings below may reference images not displayed]

FINDINGS: Tubal ligation clips noted, one in RIGHT pelvis, second in RIGHT mid
abdomen.

Surgical clips RIGHT upper quadrant consistent with cholecystectomy.

Nonobstructive bowel gas pattern with scattered stool throughout
colon.

No definite urinary tract calcifications identified.
IMPRESSION: No acute abnormalities.

## 2022-07-30 ENCOUNTER — Encounter: Payer: Self-pay | Admitting: Urology

## 2022-07-30 NOTE — Patient Instructions (Signed)

## 2022-10-10 ENCOUNTER — Encounter (INDEPENDENT_AMBULATORY_CARE_PROVIDER_SITE_OTHER): Payer: Self-pay | Admitting: Gastroenterology

## 2022-12-10 ENCOUNTER — Ambulatory Visit (INDEPENDENT_AMBULATORY_CARE_PROVIDER_SITE_OTHER): Payer: BC Managed Care – PPO | Admitting: Gastroenterology

## 2022-12-10 ENCOUNTER — Encounter (INDEPENDENT_AMBULATORY_CARE_PROVIDER_SITE_OTHER): Payer: Self-pay | Admitting: Gastroenterology

## 2022-12-10 VITALS — BP 115/76 | HR 77 | Temp 98.0°F | Ht 64.0 in | Wt 168.0 lb

## 2022-12-10 DIAGNOSIS — K582 Mixed irritable bowel syndrome: Secondary | ICD-10-CM | POA: Diagnosis not present

## 2022-12-10 DIAGNOSIS — K2100 Gastro-esophageal reflux disease with esophagitis, without bleeding: Secondary | ICD-10-CM | POA: Diagnosis not present

## 2022-12-10 NOTE — Progress Notes (Addendum)
Referring Provider: Maximiano Coss Primary Care Physician:  Maximiano Coss, MD Primary GI Physician: Previously Dr. Karilyn Cota   Chief Complaint  Patient presents with   Gastroesophageal Reflux    Patient here due to issues with her Genella Rife. She is taking Lansoprazole 30 mg daily. She does not think this has been of any help with her symptoms.    HPI:   Sabrina [name] is a 52 y.o. female with past medical history of anxiety, GERD, HH, kidney stones, hypothyroidism.   Patient presenting today for GERD.  Last seen march 2022, at that time having symptoms of GERD x20 years, off and on PPI throughout that time. Previously on omeprazole then pantoprazole. Having chest and upper abdominal pain. Previous US negative for gallstones or wall thickening, HIDA abnormal, had cholecystectomy 08/2020, abdominal pain resolved and chest pain reduced in frequency. Also feeling a lump in her throat. Having some pain and pressure between her shoulder blades, no heartburn. Some hoarseness. Also with some constipation and bloating.  Recommended dexlansoprazole 60mg  daily, EGD, colace for constipation  Present:  She states that she is on lansoprazole 30mg , has been on this for about 6 months. Previously having occasional breakthrough if she ate a trigger food or laid down too soon after eating, about 2 months ago starting having burning in chest and chest pressure with feeling acid regurgitation at night. These are the symptoms she had a few years ago when diagnosed with GERD.  Denies vomiting, rare nausea. Has taken mylanta a couple of times when she woke up and felt acid regurgitation. She is having symptoms maybe twice per week. Denies any issues with dysphagia. Previously on dexlansoprazole  60mg  for a short course then dropped down to 30mg  daily. She had good results with this but was concerned about staying on this long term due to its strength.   She reports that she goes through times where  she may have 3-4 BMs per day, other time she may go a few days without. She notes that stools sometimes are more sticky, making it hard to get herself clean, other times she may have harder stools. She takes colace 2 per day, sometimes takes miralax for a few days which will help. Denies rectal bleeding or melena. She has some bloating and gas as well as some abdominal discomfort prior to defecating, which will improve after she goes to the restroom. She reports these symptoms have been going on for the past few years. She has some gas pains at times she has to stretch out on her bed to alleviate, she takes simethicone which seems to help. No weight loss or changes in appetite.   Last Colonoscopy:2016 dr spainhour, normal  Last Endoscopy:03/05/21 - Normal hypopharynx.  - Normal proximal esophagus, mid esophagus and distal esophagus.  Mild changes of reflux esophagitis at [facility]. - Z-line regular, 34 cm from the incisors.  - 2 cm hiatal hernia. - Multiple gastric polyps. Biopsied-fundic gland polyp, no h pylori - Normal duodenal bulb and second portion of the duodenum.  Recommendations:    Past Medical History:  Diagnosis Date   Anxiety    GERD (gastroesophageal reflux disease)    History of hiatal hernia    History of kidney stones    Hypothyroidism    Hypothyroidism    PONV (postoperative nausea and vomiting)     Past Surgical History:  Procedure Laterality Date   BIOPSY  03/05/2021   Procedure: BIOPSY;  Surgeon: , MD;  [facility];  Service: Endoscopy;;  bx of gastric polyps   CESAREAN SECTION  2011   CHOLECYSTECTOMY N/A 09/13/2020   Procedure: LAPAROSCOPIC CHOLECYSTECTOMY;  Surgeon: Aviva Signs, MD;  Location: [facility];  Service: General;  Laterality: N/A;   ESOPHAGOGASTRODUODENOSCOPY (EGD) WITH PROPOFOL N/A 03/05/2021   Procedure: ESOPHAGOGASTRODUODENOSCOPY (EGD) WITH PROPOFOL;  Surgeon: Rogene Houston, MD;  [facility];  Service: Endoscopy;   Laterality: N/A;  AM   EXTRACORPOREAL SHOCK WAVE LITHOTRIPSY Left 04/15/2021   Procedure: EXTRACORPOREAL SHOCK WAVE LITHOTRIPSY (ESWL);  Surgeon: Cleon Gustin, MD;  Location: [facility];  Service: Urology;  Laterality: Left;   HERNIA REPAIR  2015   LASIK  2019   LITHOTRIPSY  2017   NASAL SINUS SURGERY     x2   TUBAL LIGATION      Current Outpatient Medications  Medication Sig Dispense Refill   busPIRone (BUSPAR) 7.5 MG tablet Take 10 mg by mouth 2 (two) times daily.     cetirizine (ZYRTEC) 10 MG tablet Take 10 mg by mouth daily.     desvenlafaxine (PRISTIQ) 50 MG 24 hr tablet Take 50 mg by mouth daily.     docusate sodium (COLACE) 100 MG capsule Take 100 mg by mouth 2 (two) times daily.     eletriptan (RELPAX) 20 MG tablet Take 20 mg by mouth as needed for migraine or headache. May repeat in 2 hours if headache persists or recurs.     fluticasone (FLONASE) 50 MCG/ACT nasal spray Place into both nostrils daily.     levothyroxine (SYNTHROID) 100 MCG tablet Take 1 tablet (100 mcg total) by mouth daily before breakfast. 90 tablet 3   OVER THE COUNTER MEDICATION Super C complex once per day  Super B complex once per day  Zinc 50 mg once per day.     Selenium 200 MCG CAPS Take 200 mcg by mouth daily.     valACYclovir (VALTREX) 500 MG tablet Take 500 mg by mouth daily as needed (outbreak).     omeprazole (PRILOSEC) 40 MG capsule Take 1 capsule (40 mg total) by mouth daily. 90 capsule 0   No current facility-administered medications for this visit.    Allergies as of 12/10/2022 - Review Complete 12/10/2022  Allergen Reaction Noted   Erythromycin Nausea And Vomiting 09/11/2013   Nitrofurantoin Palpitations 09/11/2013    Family History  Problem Relation Age of Onset   Cancer Mother    Breast cancer Mother    Heart disease Father    Diabetes Maternal Grandfather    Thyroid disease Paternal Grandmother    Colon cancer Paternal Grandmother    Renal cancer Paternal Grandmother     Glaucoma Brother    Anxiety disorder Brother    Hypercholesterolemia Brother    Healthy Son    Irritable bowel syndrome Son     Social History   Socioeconomic History   Marital status: Married    Spouse name: Not on file   Number of children: 2   Years of education: Not on file   Highest education level: Not on file  Occupational History   Occupation: Glass blower/designer  Tobacco Use   Smoking status: Never   Smokeless tobacco: Never  Vaping Use   Vaping Use: Never used  Substance and Sexual Activity   Alcohol use: Never   Drug use: Never   Sexual activity: Yes  Other Topics Concern   Not on file  Social History Narrative   ** Merged History Encounter **  She has 1 adopted child, 71 years old in 09/2020   Social Determinants of Health   Financial Resource Strain: Not on file  Food Insecurity: Not on file  Transportation Needs: Not on file  Physical Activity: Not on file  Stress: Not on file  Social Connections: Not on file   Review of systems General: negative for malaise, night sweats, fever, chills, weight loss Neck: Negative for lumps, goiter, pain and significant neck swelling Resp: Negative for cough, wheezing, dyspnea at rest CV: Negative for chest pain, leg swelling, palpitations, orthopnea GI: denies melena, hematochezia, nausea, vomiting, dysphagia, odyonophagia, early satiety or unintentional weight loss. +GERD symptoms/chest pressure +loose stools +constipation  MSK: Negative for joint pain or swelling, back pain, and muscle pain. Derm: Negative for itching or rash Psych: Denies depression, anxiety, memory loss, confusion. No homicidal or suicidal ideation.  Heme: Negative for prolonged bleeding, bruising easily, and swollen nodes. Endocrine: Negative for cold or heat intolerance, polyuria, polydipsia and goiter. Neuro: negative for tremor, gait imbalance, syncope and seizures. The remainder of the review of systems is noncontributory.  Physical  Exam: BP 115/76 (BP Location: Left Arm, Patient Position: Sitting, Cuff Size: Small)   Pulse 77   Temp 98 F (36.7 C) (Temporal)   Ht 5\' 4"  (1.626 m)   Wt 168 lb (76.2 kg)   BMI 28.84 kg/m  General:   Alert and oriented. No distress noted. Pleasant and cooperative.  Head:  Normocephalic and atraumatic. Eyes:  Conjuctiva clear without scleral icterus. Mouth:  Oral mucosa pink and moist. Good dentition. No lesions. Heart: Normal rate and rhythm, s1 and s2 heart sounds present.  Lungs: Clear lung sounds in all lobes. Respirations equal and unlabored. Abdomen:  +BS, soft, non-tender and non-distended. No rebound or guarding. No HSM or masses noted. Derm: No palmar erythema or jaundice Msk:  Symmetrical without gross deformities. Normal posture. Extremities:  Without edema. Neurologic:  Alert and  oriented x4 Psych:  Alert and cooperative. Normal mood and affect.  Invalid input(s): "6 MONTHS"   ASSESSMENT: Sabrina [name] is a 52 y.o. female presenting today as a new patient for GERD and mix of constipation/loose stools.  GERD: Currently on lansoprazole 30 mg once a day, having breakthrough symptoms 2-3 times per week with some acid regurgitation and chest pressure for the past 2 months.  Has been on dexlansoprazole in the past with good results however was concerned about taking the medication due to the strength.  No alarm symptoms at this time to indicate endoscopic evaluation and last endoscopy was in 2022.  Will stop lansoprazole and start omeprazole 40 mg once a day.  Advised on strict reflux precautions to include  Avoiding  greasy, spicy, fried, citrus/tomato based foods, and be mindful that caffeine, carbonated drinks, chocolate and alcohol can increase reflux symptoms. Stay upright 2-3 hours after eating, prior to lying down and avoid eating late in the evenings.  Constipation/loose stools: Mix of both constipation and looser stools over the past few years with some abdominal  discomfort usually improved with defecation, as well as some excess flatulence. 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 She has no alarm features.  I explained indications of IBS with the patient to include hypersensitivity of the bowels that is often heavily influenced by certain food/drink triggers and emotional/mental triggers. We discussed the importance of keeping a food journal x2-3 weeks and utilizing the low FODMAP guide to help determine specific trigger foods and increasing foods that tend to be more well tolerated.  Recommend starting benefiber 1T BID with meals, ensure good water intake, can also start daily probiotic.     PLAN:  Stop lansoprazole  2. Start omeprazole 40mg  daily, reflux precautions  3. Low fodmap food guide 4. Benefiber 1T BID  5. Good water intake, aim for 64 oz per day 6. Good Stress management 7. Start daily probiotic  8. Continue simethicone PRN for gas  All questions were answered, patient verbalized understanding and is in agreement with plan as outlined above.    Follow Up: 2 months   Sabrina [name] L. , MSN, APRN, AGNP-C Adult-Gerontology Nurse Practitioner [facility]   I have reviewed the note and agree with the APP's assessment as described in this progress note  Patient with PPI controlled GERD.  Would be interesting to discuss TIF as an option to spare her from PPIs if she indeed has GERD.  [facility], MD Gastroenterology and Hepatology [facility] Gastroenterology

## 2022-12-10 NOTE — Patient Instructions (Addendum)
I am providing the low FODMAP diet You should keep a food journal for the next few weeks to help correlate if symptoms are related to certain foods You can try eliminating FODMAPS listed on the provided handout. If you notice no change in your symptoms, you can slowly start adding these foods/ingredients back in to see if they are in fact tolerated. You should avoid foods that tend to illicit your symptoms. Stress can also play a big role in IBS, so stress management is important  You can start benefiber 1T twice daily with a meal, make sure you are drinking plenty of water, aim for 64oz per day You can also try starting a daily probiotic as discussed You can continue to use simethicone as needed for gas  In regards to acid reflux, I have sent Omeprazole 40mg   to your pharmacy( please stop lansoprazole) Please take this 30 minutes prior to breakfast Avoid greasy, spicy, fried, citrus foods, and be mindful that caffeine, carbonated drinks, chocolate and alcohol can increase reflux symptoms Stay upright 2-3 hours after eating, prior to lying down and avoid eating late in the evenings.  Follow up 2 months

## 2022-12-11 ENCOUNTER — Other Ambulatory Visit (INDEPENDENT_AMBULATORY_CARE_PROVIDER_SITE_OTHER): Payer: Self-pay | Admitting: *Deleted

## 2022-12-11 MED ORDER — OMEPRAZOLE 40 MG PO CPDR: 40.0000 mg | DELAYED_RELEASE_CAPSULE | Freq: Every day | ORAL | 0 refills | Status: DC

## 2022-12-31 ENCOUNTER — Encounter: Payer: Self-pay | Admitting: Internal Medicine

## 2022-12-31 ENCOUNTER — Ambulatory Visit: Payer: BC Managed Care – PPO | Admitting: Internal Medicine

## 2022-12-31 VITALS — BP 120/80 | HR 74 | Ht 64.0 in | Wt 170.0 lb

## 2022-12-31 DIAGNOSIS — E063 Autoimmune thyroiditis: Secondary | ICD-10-CM | POA: Diagnosis not present

## 2022-12-31 DIAGNOSIS — Z8639 Personal history of other endocrine, nutritional and metabolic disease: Secondary | ICD-10-CM

## 2022-12-31 DIAGNOSIS — E038 Other specified hypothyroidism: Secondary | ICD-10-CM | POA: Diagnosis not present

## 2022-12-31 LAB — T4, FREE: Free T4: 0.99 ng/dL (ref 0.60–1.60)

## 2022-12-31 LAB — TSH: TSH: 0.57 u[IU]/mL (ref 0.35–5.50)

## 2022-12-31 MED ORDER — LEVOTHYROXINE SODIUM 100 MCG PO TABS: 100.0000 ug | ORAL_TABLET | Freq: Every day | ORAL | 3 refills | Status: DC

## 2022-12-31 NOTE — Progress Notes (Signed)
Patient ID: Sabrina [name], female   DOB: 09/04/1971, 52 y.o.   MRN: [id number]   HPI  Sabrina [name] is a 52 y.o.-year-old female, initially referred by her PCP, Dr. Laney Pastor (PCP in [place]), returning for follow-up for Hashimoto's hypothyroidism.  She also has a history of thyroid nodule.  She previously saw several endocrinologists.  Last visit with me 1 year ago. She lives in [place].  She sees Dr. Helane Rima.    Interim history: She feels well, without mental fog, hot flashes, or significant weight changes. Her 20 year old son was recently found to have a slightly high TSH x2, after a URI.  Hypothyroidism: - dx'ed in 2014, during investigation for fatigue. At that time, she had thyroid imaging and was found to have thyroid nodules.  She is on levothyroxine 100 mcg: - fasting - with water - separated by >30 min from b'fast  - no calcium, iron - stopped multivitamins later in the day - + prev. on Protonix 40 mg daily at bedtime >> changed to Dexilant 30 min after LT4  (!) >> moved to bedtime at last visit >> now stopped - on Prevacid at night >> now Omeprazole at night. - on Super B complex (400 mcg) - last dose 1.5 weeks ago On vit C, D, Zn.  I reviewed pt's thyroid tests: Lab Results  Component Value Date   TSH 0.45 12/25/2021   TSH 1.030 05/15/2021   TSH 0.87 10/10/2020   FREET4 1.12 12/25/2021   FREET4 1.33 05/15/2021   FREET4 1.19 10/10/2020   T3FREE 2.6 05/15/2021  07/04/2020: TSH 1.27 03/21/2020: TSH 1.06, free T4 1.4 02/23/2020: TSH 1.34 (0.45-4.5), free T4 1.37, total T3 101 04/10/2014: TSH 0.86 (0.34-5.66), free T4 1.12 (0.52-1.21)  Antithyroid antibodies: Component     Latest Ref Rng & Units 12/25/2021  Thyroglobulin Ab     < or = 1 IU/mL 1  Thyroperoxidase Ab SerPl-aCnc     <9 IU/mL 123 (H)   Component     Latest Ref Rng & Units 10/10/2020 05/15/2021  Thyroglobulin Ab     < or = 1 IU/mL 2 (H)   Thyroperoxidase Ab SerPl-aCnc     0 - 34 IU/mL 86 (H)  128 (H)  Thyroglobulin Antibody     0.0 - 0.9 IU/mL  1.4 (H)  03/21/2020: TPO antibodies 169 02/23/2020: TPO antibodies 190 (0-34) No components found for: "TPOAB"   We started selenium 200 mcg daily in 09/2020.  She started to feel much better after starting this.  She previously described: - weight gain - fatigue - cold intolerance - depression - constipation - dry skin - hair loss The symptoms improved.  Thyroid nodule:  Reviewed previous investigation: Thyroid ultrasound (02/2013): 1.7 x 1.03 cm right mid thyroid nodule Thyroid ultrasound in office-Dr. Esclamado (08/2013): No obvious thyroid nodule Thyroid ultrasound (04/10/2014): Heterogeneous, thyroid lobes, without thyroid nodules.  Small lymph nodes, benign-appearing.  Thyroid ultrasound (02/20/2020): Heterogeneous thyroid with increased vascularity throughout the entire gland, no nodules  Pt denies: - feeling nodules in neck - dysphagia - choking - SOB with lying down She described increased fullness in her neck and also hoarseness after having had COVID-19 in 01/2020.  Interestingly, the fullness in her neck improved after gallbladder surgery 08/2020.  She still has some fullness in her neck when lying on her back to watch TV at night.    She has + FH of thyroid disorders in: PGM. 3 great aunts with thyroid ds. No FH of thyroid cancer.  No h/o radiation tx to head or neck. No recent use of iodine supplements.  She has a h/o 2 miscarriages. She had + ANA.  She saw rheumatology in the past. She has a history of kidney stones-status post ESWL.  ROS: + See HPI  Past Medical History:  Diagnosis Date   Anxiety    GERD (gastroesophageal reflux disease)    History of hiatal hernia    History of kidney stones    Hypothyroidism    Hypothyroidism    PONV (postoperative nausea and vomiting)    Past Surgical History:  Procedure Laterality Date   BIOPSY  03/05/2021   Procedure: BIOPSY;  Surgeon: Malissa Hippo, MD;   [facility];  Service: Endoscopy;;  bx of gastric polyps   CESAREAN SECTION  2011   CHOLECYSTECTOMY N/A 09/13/2020   Procedure: LAPAROSCOPIC CHOLECYSTECTOMY;  Surgeon: Franky Macho, MD;  Location: [facility];  Service: General;  Laterality: N/A;   ESOPHAGOGASTRODUODENOSCOPY (EGD) WITH PROPOFOL N/A 03/05/2021   Procedure: ESOPHAGOGASTRODUODENOSCOPY (EGD) WITH PROPOFOL;  Surgeon: Malissa Hippo, MD;  [facility];  Service: Endoscopy;  Laterality: N/A;  AM   EXTRACORPOREAL SHOCK WAVE LITHOTRIPSY Left 04/15/2021   Procedure: EXTRACORPOREAL SHOCK WAVE LITHOTRIPSY (ESWL);  Surgeon: Malen Gauze, MD;  Location: [facility];  Service: Urology;  Laterality: Left;   HERNIA REPAIR  2015   LASIK  2019   LITHOTRIPSY  2017   NASAL SINUS SURGERY     x2   TUBAL LIGATION     Social History   Socioeconomic History   Marital status: Married    Spouse name: Not on file   Number of children: 2   Years of education: Not on file   Highest education level: Not on file  Occupational History   Occupation: Print production planner  Tobacco Use   Smoking status: Never   Smokeless tobacco: Never  Vaping Use   Vaping Use: Never used  Substance and Sexual Activity   Alcohol use: Never   Drug use: Never   Sexual activity: Yes  Other Topics Concern   Not on file  Social History Narrative   ** Merged History Encounter **       She has 1 adopted child, 35 years old in 09/2020   Social Determinants of Health   Financial Resource Strain: Not on file  Food Insecurity: Not on file  Transportation Needs: Not on file  Physical Activity: Not on file  Stress: Not on file  Social Connections: Not on file  Intimate Partner Violence: Not on file   Current Outpatient Medications on File Prior to Visit  Medication Sig Dispense Refill   busPIRone (BUSPAR) 7.5 MG tablet Take 10 mg by mouth 2 (two) times daily.     cetirizine (ZYRTEC) 10 MG tablet Take 10 mg by mouth daily.     desvenlafaxine  (PRISTIQ) 50 MG 24 hr tablet Take 50 mg by mouth daily.     docusate sodium (COLACE) 100 MG capsule Take 100 mg by mouth 2 (two) times daily.     eletriptan (RELPAX) 20 MG tablet Take 20 mg by mouth as needed for migraine or headache. May repeat in 2 hours if headache persists or recurs.     fluticasone (FLONASE) 50 MCG/ACT nasal spray Place into both nostrils daily.     levothyroxine (SYNTHROID) 100 MCG tablet Take 1 tablet (100 mcg total) by mouth daily before breakfast. 90 tablet 3   omeprazole (PRILOSEC) 40 MG capsule Take 1  capsule (40 mg total) by mouth daily. 90 capsule 0   OVER THE COUNTER MEDICATION Super C complex once per day  Super B complex once per day  Zinc 50 mg once per day.     Selenium 200 MCG CAPS Take 200 mcg by mouth daily.     valACYclovir (VALTREX) 500 MG tablet Take 500 mg by mouth daily as needed (outbreak).     No current facility-administered medications on file prior to visit.   Allergies  Allergen Reactions   Erythromycin Nausea And Vomiting   Nitrofurantoin Palpitations   Family History  Problem Relation Age of Onset   Cancer Mother    Breast cancer Mother    Heart disease Father    Diabetes Maternal Grandfather    Thyroid disease Paternal Grandmother    Colon cancer Paternal Grandmother    Renal cancer Paternal Grandmother    Glaucoma Brother    Anxiety disorder Brother    Hypercholesterolemia Brother    Healthy Son    Irritable bowel syndrome Son    + See HPI  PE: BP 120/80 (BP Location: Left Arm, Patient Position: Sitting, Cuff Size: Small)   Pulse 74   Ht 5\' 4"  (1.626 m)   Wt 170 lb (77.1 kg)   SpO2 96%   BMI 29.18 kg/m  Wt Readings from Last 3 Encounters:  12/31/22 170 lb (77.1 kg)  12/10/22 168 lb (76.2 kg)  12/25/21 166 lb 12.8 oz (75.7 kg)   Constitutional: Normal weight, in NAD Eyes:  EOMI, no exophthalmos ENT: + slight symmetric thyromegaly, no cervical lymphadenopathy Cardiovascular: RRR, No MRG Respiratory: CTA  B Musculoskeletal: no deformities Skin: no rashes Neurological: no tremor with outstretched hands  ASSESSMENT: 1.  Hashimoto's hypothyroidism  2.  History of thyroid nodules  PLAN:  1. Patient with long history of hypothyroidism, on stable dose of levothyroxine -She is on selenium 200 mcg daily - latest thyroid labs reviewed with pt. >> normal: Lab Results  Component Value Date   TSH 0.45 12/25/2021  - she continues on LT4 100 mcg daily - pt feels good on this dose.  Previously had mental fog and fatigue, but these improved. - we discussed about taking the thyroid hormone every day, with water, >30 minutes before breakfast, separated by >4 hours from acid reflux medications, calcium, iron, multivitamins. Pt. is taking it correctly.  She was previously taking Dexilant only 30 minutes after levothyroxine and remove this later in the day, but afterwards she was able to stop and switch to Prevacid, and now omeprazole, which she takes at night. - will check thyroid tests today: TSH and fT4 - If labs are abnormal, she will need to return for repeat TFTs in 1.5 months -She asks me in my opinion about how to manage her sounds hypothyroidism.  He needs close follow-up, but since he has fatigue, we discussed about possibly checking him for adrenal insufficiency, if this persists.  - I will see her in a year   2.  History of thyroid nodules -She has a history of thyroid enlargement after her COVID-19 diagnosis, and is improved towards the end of 2021 -She had a right mid thyroid dominant nodule on her initial ultrasound from 02/2013 but she had 3 subsequent ultrasounds including in 2021, showing only signs of inflammation, as expected in the setting of Hashimoto's thyroiditis, but without clear nodules -At today's visit, she has no neck compression symptoms.  She has a history of hoarseness due to acid reflux -No further  investigation is needed for this  Needs refills.  Component     Latest  Ref Rng 12/31/2022  TSH     0.35 - 5.50 uIU/mL 0.57   T4,Free(Direct)     0.60 - 1.60 ng/dL 0.99   Thyroid tests are normal.  Philemon Kingdom, MD PhD [facility] Endocrinology

## 2023-02-15 ENCOUNTER — Ambulatory Visit (INDEPENDENT_AMBULATORY_CARE_PROVIDER_SITE_OTHER): Payer: BC Managed Care – PPO | Admitting: Gastroenterology

## 2023-02-18 ENCOUNTER — Ambulatory Visit (INDEPENDENT_AMBULATORY_CARE_PROVIDER_SITE_OTHER): Payer: BC Managed Care – PPO | Admitting: Gastroenterology

## 2023-02-18 ENCOUNTER — Encounter (INDEPENDENT_AMBULATORY_CARE_PROVIDER_SITE_OTHER): Payer: Self-pay | Admitting: Gastroenterology

## 2023-02-18 VITALS — BP 108/75 | HR 73 | Temp 98.2°F | Ht 64.0 in | Wt 168.1 lb

## 2023-02-18 DIAGNOSIS — R09A2 Foreign body sensation, throat: Secondary | ICD-10-CM | POA: Diagnosis not present

## 2023-02-18 DIAGNOSIS — K2100 Gastro-esophageal reflux disease with esophagitis, without bleeding: Secondary | ICD-10-CM | POA: Diagnosis not present

## 2023-02-18 DIAGNOSIS — K582 Mixed irritable bowel syndrome: Secondary | ICD-10-CM

## 2023-02-18 MED ORDER — OMEPRAZOLE 40 MG PO CPDR: 40.0000 mg | DELAYED_RELEASE_CAPSULE | Freq: Every day | ORAL | 3 refills | Status: DC

## 2023-02-18 NOTE — Progress Notes (Addendum)
Referring Provider: Yvone Neu Primary Care Physician:  Yvone Neu, MD Primary GI Physician: Jenetta Downer   Chief Complaint  Patient presents with   Gastroesophageal Reflux    Follow up on GERD. Was switched to omeprazole and feels better but having some burning and chest pain. IBS C and D is about the same. Has added in the benefiber.    HPI:   Sabrina [name] is a 52 y.o. female with past medical history of anxiety, GERD, HH, kidney stones, hypothyroidism.   Patient presenting today for follow up of GERD/IBS  Last seen January 2023, at that time on lansoprazole 30mg , has been on this for about 6 months. Previously having occasional breakthrough if she ate a trigger food or laid down too soon after eating, about 2 months ago starting having burning in chest and chest pressure with feeling acid regurgitation at night. She is having symptoms maybe twice per week. Denies any issues with dysphagia. Previously on dexlansoprazole  60mg  for a short course then dropped down to 30mg  daily. She had good results with this but was concerned about staying on this long term due to its strength.    She reports that she goes through times where she may have 3-4 BMs per day, other time she may go a few days without a BM. She notes that stools sometimes are more sticky, making it hard to get herself clean, other times she may have harder stools. She takes colace 2 per day, sometimes takes miralax for a few days which will help. Denies rectal bleeding or melena. She has some bloating and gas as well as some abdominal discomfort prior to defecating.   Recommended to start omeprazole 40mg  daily, low FODMAP guide, benefiber 1g BID, daily probiotic, simethicone PRN for gas.    Thyroid testing in February was normal.   Present: Reports she is doing better, though still having some issues. She is trying to avoid certain foods. She continues to have a globus sensation in her throat and  some pressure/pain in mid chest. She has some occasional heartburn though this is improved since starting omeprazole. Denies acid regurgitation. She is having to clear her throat often, denies sore throat, has a dry, nagging cough at times. Denies nausea or vomiting.    She got benefiber but is having trouble remembering to do it. Stools are about the same as last time. She has woken up feeling bloated and having abdominal cramping this week. She notes this is different from usual as she will sometimes have cramping after eating certain foods. She has taken simethicone when she feels bloated with improvement in symptoms. Denies weight loss or changes in appetite. BMs are very sporadic, she may have 3 BMs per day or go three days without a BM. Stools are more formed, sometimes she has constipation. Denies rectal bleeding or melena.   Last Colonoscopy:2016 dr spainhour, normal   Last Endoscopy:03/05/21 - Normal hypopharynx.  - Normal proximal esophagus, mid esophagus and distal esophagus.  Mild changes of reflux esophagitis at GEJ. - Z-line regular, 34 cm from the incisors.  - 2 cm hiatal hernia. - Multiple gastric polyps. Biopsied-fundic gland polyp, no h pylori - Normal duodenal bulb and second portion of the duodenum.   Past Medical History:  Diagnosis Date   Anxiety    GERD (gastroesophageal reflux disease)    History of hiatal hernia    History of kidney stones    Hypothyroidism    Hypothyroidism  PONV (postoperative nausea and vomiting)     Past Surgical History:  Procedure Laterality Date   BIOPSY  03/05/2021   Procedure: BIOPSY;  Surgeon: Rogene Houston, MD;  [facility];  Service: Endoscopy;;  bx of gastric polyps   CESAREAN SECTION  2011   CHOLECYSTECTOMY N/A 09/13/2020   Procedure: LAPAROSCOPIC CHOLECYSTECTOMY;  Surgeon: Aviva Signs, MD;  Location: [facility];  Service: General;  Laterality: N/A;   ESOPHAGOGASTRODUODENOSCOPY (EGD) WITH PROPOFOL N/A 03/05/2021    Procedure: ESOPHAGOGASTRODUODENOSCOPY (EGD) WITH PROPOFOL;  Surgeon: Rogene Houston, MD;  [facility];  Service: Endoscopy;  Laterality: N/A;  AM   EXTRACORPOREAL SHOCK WAVE LITHOTRIPSY Left 04/15/2021   Procedure: EXTRACORPOREAL SHOCK WAVE LITHOTRIPSY (ESWL);  Surgeon: Cleon Gustin, MD;  Location: [facility];  Service: Urology;  Laterality: Left;   HERNIA REPAIR  2015   LASIK  2019   LITHOTRIPSY  2017   NASAL SINUS SURGERY     x2   TUBAL LIGATION      Current Outpatient Medications  Medication Sig Dispense Refill   busPIRone (BUSPAR) 10 MG tablet Take 10 mg by mouth 2 (two) times daily.     cetirizine (ZYRTEC) 10 MG tablet Take 10 mg by mouth daily.     desvenlafaxine (PRISTIQ) 50 MG 24 hr tablet Take 50 mg by mouth daily.     docusate sodium (COLACE) 100 MG capsule Take 100 mg by mouth 2 (two) times daily.     eletriptan (RELPAX) 20 MG tablet Take 20 mg by mouth as needed for migraine or headache. May repeat in 2 hours if headache persists or recurs.     fluticasone (FLONASE) 50 MCG/ACT nasal spray Place into both nostrils daily.     levothyroxine (SYNTHROID) 100 MCG tablet Take 1 tablet (100 mcg total) by mouth daily before breakfast. 90 tablet 3   omeprazole (PRILOSEC) 40 MG capsule Take 1 capsule (40 mg total) by mouth daily. 90 capsule 0   OVER THE COUNTER MEDICATION Super C complex once per day  Super B complex once per day  Zinc 50 mg once per day.     Selenium 200 MCG CAPS Take 200 mcg by mouth daily.     valACYclovir (VALTREX) 500 MG tablet Take 500 mg by mouth daily as needed (outbreak).     No current facility-administered medications for this visit.    Allergies as of 02/18/2023 - Review Complete 02/18/2023  Allergen Reaction Noted   Erythromycin Nausea And Vomiting 09/11/2013   Nitrofurantoin Palpitations 09/11/2013    Family History  Problem Relation Age of Onset   Cancer Mother    Breast cancer Mother    Heart disease Father    Diabetes  Maternal Grandfather    Thyroid disease Paternal Grandmother    Colon cancer Paternal Grandmother    Renal cancer Paternal Grandmother    Glaucoma Brother    Anxiety disorder Brother    Hypercholesterolemia Brother    Healthy Son    Irritable bowel syndrome Son     Social History   Socioeconomic History   Marital status: Married    Spouse name: Not on file   Number of children: 2   Years of education: Not on file   Highest education level: Not on file  Occupational History   Occupation: Glass blower/designer  Tobacco Use   Smoking status: Never    Passive exposure: Never   Smokeless tobacco: Never  Vaping Use   Vaping Use: Never used  Substance and Sexual Activity   Alcohol use: Never   Drug use: Never   Sexual activity: Yes  Other Topics Concern   Not on file  Social History Narrative   ** Merged History Encounter **       She has 1 adopted child, 81 years old in 09/2020   Social Determinants of Health   Financial Resource Strain: Not on file  Food Insecurity: Not on file  Transportation Needs: Not on file  Physical Activity: Not on file  Stress: Not on file  Social Connections: Not on file    Review of systems General: negative for malaise, night sweats, fever, chills, weight los Neck: Negative for lumps, goiter, pain and significant neck swelling Resp: Negative for cough, wheezing, dyspnea at rest CV: Negative for chest pain, leg swelling, palpitations, orthopnea GI: denies melena, hematochezia, nausea, vomiting, diarrhea, constipation, dysphagia, odyonophagia, early satiety or unintentional weight loss. +globus sensation +chest pressure +heartburn MSK: Negative for joint pain or swelling, back pain, and muscle pain. Derm: Negative for itching or rash Psych: Denies depression, anxiety, memory loss, confusion. No homicidal or suicidal ideation.  Heme: Negative for prolonged bleeding, bruising easily, and swollen nodes. Endocrine: Negative for cold or heat  intolerance, polyuria, polydipsia and goiter. Neuro: negative for tremor, gait imbalance, syncope and seizures. The remainder of the review of systems is noncontributory.  Physical Exam: BP 108/75 (BP Location: Left Arm, Patient Position: Sitting, Cuff Size: Normal)   Pulse 73   Temp 98.2 F (36.8 C) (Oral)   Ht 5\' 4"  (1.626 m)   Wt 168 lb 1.6 oz (76.2 kg)   BMI 28.85 kg/m  General:   Alert and oriented. No distress noted. Pleasant and cooperative.  Head:  Normocephalic and atraumatic. Eyes:  Conjuctiva clear without scleral icterus. Mouth:  Oral mucosa pink and moist. Good dentition. No lesions. Heart: Normal rate and rhythm, s1 and s2 heart sounds present.  Lungs: Clear lung sounds in all lobes. Respirations equal and unlabored. Abdomen:  +BS, soft, non-tender and non-distended. No rebound or guarding. No HSM or masses noted. Derm: No palmar erythema or jaundice Msk:  Symmetrical without gross deformities. Normal posture. Extremities:  Without edema. Neurologic:  Alert and  oriented x4 Psych:  Alert and cooperative. Normal mood and affect.  Invalid input(s): "6 MONTHS"   ASSESSMENT: Amy-Lee Minhas is a 52 y.o. female presenting today for follow up of IBS and GERD.  GERD: improved with change to omeprazole, though still having globus sensation and some pressure in mid chest sometimes worse with eating. Recommend proceeding with EGD for further evaluation. I briefly discussed the TIF procedure with the patient as given her refractory GERD symptoms on PPI therapy, this may be an option for her, I will provide the brochure for her to look over. Will continue with PPI and reflux precautions. Indications, risks and benefits of procedure discussed in detail with patient. Patient verbalized understanding and is in agreement to proceed with EGD.  IBS- symptoms are about the same, can have multiple BMs per day, sometimes none. Feels more constipated at times, has some bloating relieved  by simethicone. She has not consistently taken benefiber as recommended at last visit, would recommend trying Benefiber 1T BID with meals, good water intake and taking this consistently.    PLAN:  TIF brochure  2. EGD ASA II  3. Continue omeprazole 40mg  daily 4. Reflux precautions  5. Benefiber 1T BID with meals, good water intake   All questions were answered, patient  verbalized understanding and is in agreement with plan as outlined above.    Follow Up: 3 months   Adem Costlow L. Alver Sorrow, MSN, APRN, AGNP-C Adult-Gerontology Nurse Practitioner [facility]  I have reviewed the note and agree with the APP's assessment as described in this progress note  Maylon Peppers, MD Gastroenterology and Hepatology [facility]

## 2023-02-18 NOTE — Patient Instructions (Addendum)
We will get you scheduled for upper endoscopy, I will provide you with the TIF brochure for you to look over as this may be a good option for you given your ongoing GERD Continue to Avoid greasy, spicy, fried, citrus foods, and be mindful that caffeine, carbonated drinks, chocolate and alcohol can increase reflux symptoms. Stay upright 2-3 hours after eating, prior to lying down and avoid eating late in the evenings.  Continue omeprazole 40mg  daily  Follow up 3 months  It was a pleasure to see you today. I want to create trusting relationships with patients and provide genuine, compassionate, and quality care. I truly value your feedback! please be on the lookout for a survey regarding your visit with me today. I appreciate your input about our visit and your time in completing this!    Cayli Escajeda L. Alver Sorrow, MSN, APRN, AGNP-C Adult-Gerontology Nurse Practitioner [facility] at [facility]

## 2023-03-03 ENCOUNTER — Telehealth (INDEPENDENT_AMBULATORY_CARE_PROVIDER_SITE_OTHER): Payer: Self-pay | Admitting: Gastroenterology

## 2023-03-03 NOTE — Telephone Encounter (Signed)
PA via [facility] for upcoming EGD Order ID: [id number]       Authorized  Approval Valid Through: 03/03/2023 - 05/01/2023

## 2023-03-09 ENCOUNTER — Telehealth (INDEPENDENT_AMBULATORY_CARE_PROVIDER_SITE_OTHER): Payer: Self-pay | Admitting: Gastroenterology

## 2023-03-09 NOTE — Telephone Encounter (Signed)
Pt left voicemail needing to cancel EGD that was scheduled for Thursday.  Pt has been taken off schedule. Left message for pt to return call if she wanted to reschedule.

## 2023-03-11 ENCOUNTER — Encounter (HOSPITAL_COMMUNITY): Admission: RE | Payer: Self-pay | Source: Home / Self Care

## 2023-03-11 ENCOUNTER — Ambulatory Visit (HOSPITAL_COMMUNITY)
Admission: RE | Admit: 2023-03-11 | Payer: BC Managed Care – PPO | Source: Home / Self Care | Admitting: Gastroenterology

## 2023-03-11 SURGERY — ESOPHAGOGASTRODUODENOSCOPY (EGD) WITH PROPOFOL
Anesthesia: Monitor Anesthesia Care

## 2023-05-20 ENCOUNTER — Telehealth (INDEPENDENT_AMBULATORY_CARE_PROVIDER_SITE_OTHER): Payer: Self-pay | Admitting: Gastroenterology

## 2023-05-20 NOTE — Telephone Encounter (Signed)
Pt contacted and EGD scheduled for 06/28/23 at 8:15am. PA pending at this time via [employer]. Instructions sent via mychart.

## 2023-05-20 NOTE — Telephone Encounter (Signed)
Pt left voicemail wanting to know if she could re schedule her EGD. She had cancelled in April due to finacial issues. Pt states if she could have this done by 06/30/23 that would be good because her insurance renews on 07/01/23. Pt was seen in office back in march. Pt states she still continues to have issues as you and her had discussed. Please advise if ok to reschedule or if you need to see her in office first. Thank you!  (Per March office note-ASA II)

## 2023-05-24 NOTE — Telephone Encounter (Signed)
Per [employer]  Order ID: [id number]       Authorized  Approval Valid Through: 05/24/2023 - 07/22/2023

## 2023-06-08 ENCOUNTER — Ambulatory Visit (INDEPENDENT_AMBULATORY_CARE_PROVIDER_SITE_OTHER): Payer: BC Managed Care – PPO | Admitting: Gastroenterology

## 2023-06-28 ENCOUNTER — Other Ambulatory Visit: Payer: Self-pay

## 2023-06-28 ENCOUNTER — Ambulatory Visit (HOSPITAL_COMMUNITY)
Admission: RE | Admit: 2023-06-28 | Discharge: 2023-06-28 | Disposition: A | Discharge status: Home / Self Care | Payer: BC Managed Care – PPO | Attending: Gastroenterology | Admitting: Gastroenterology

## 2023-06-28 ENCOUNTER — Encounter (HOSPITAL_COMMUNITY): Payer: Self-pay | Admitting: Gastroenterology

## 2023-06-28 ENCOUNTER — Ambulatory Visit (HOSPITAL_COMMUNITY): Payer: BC Managed Care – PPO | Admitting: Anesthesiology

## 2023-06-28 ENCOUNTER — Encounter (HOSPITAL_COMMUNITY)
Admission: RE | Disposition: A | Discharge status: Home / Self Care | Payer: Self-pay | Source: Home / Self Care | Attending: Gastroenterology

## 2023-06-28 DIAGNOSIS — F419 Anxiety disorder, unspecified: Secondary | ICD-10-CM | POA: Insufficient documentation

## 2023-06-28 DIAGNOSIS — J029 Acute pharyngitis, unspecified: Secondary | ICD-10-CM | POA: Insufficient documentation

## 2023-06-28 DIAGNOSIS — E039 Hypothyroidism, unspecified: Secondary | ICD-10-CM | POA: Diagnosis not present

## 2023-06-28 DIAGNOSIS — R09A2 Foreign body sensation, throat: Secondary | ICD-10-CM | POA: Diagnosis not present

## 2023-06-28 DIAGNOSIS — Z79899 Other long term (current) drug therapy: Secondary | ICD-10-CM | POA: Insufficient documentation

## 2023-06-28 DIAGNOSIS — K449 Diaphragmatic hernia without obstruction or gangrene: Secondary | ICD-10-CM | POA: Diagnosis not present

## 2023-06-28 DIAGNOSIS — F458 Other somatoform disorders: Secondary | ICD-10-CM | POA: Insufficient documentation

## 2023-06-28 DIAGNOSIS — K219 Gastro-esophageal reflux disease without esophagitis: Secondary | ICD-10-CM | POA: Insufficient documentation

## 2023-06-28 DIAGNOSIS — R12 Heartburn: Secondary | ICD-10-CM | POA: Diagnosis present

## 2023-06-28 HISTORY — PX: ESOPHAGOGASTRODUODENOSCOPY (EGD) WITH PROPOFOL: SHX5813

## 2023-06-28 LAB — POCT PREGNANCY, URINE: Preg Test, Ur: NEGATIVE

## 2023-06-28 SURGERY — ESOPHAGOGASTRODUODENOSCOPY (EGD) WITH PROPOFOL
Anesthesia: General

## 2023-06-28 MED ORDER — LIDOCAINE HCL 1 % IJ SOLN
INTRAMUSCULAR | Status: DC | PRN
  Administered 2023-06-28: 50 mg via INTRADERMAL

## 2023-06-28 MED ORDER — LACTATED RINGERS IV SOLN: INTRAVENOUS | Status: DC

## 2023-06-28 MED ORDER — OMEPRAZOLE 40 MG PO CPDR: 40.0000 mg | DELAYED_RELEASE_CAPSULE | Freq: Two times a day (BID) | ORAL | 1 refills | Status: DC

## 2023-06-28 MED ORDER — PROPOFOL 10 MG/ML IV BOLUS
INTRAVENOUS | Status: DC | PRN
Start: 2023-06-28 — End: 2023-06-28
  Administered 2023-06-28: 20 mg via INTRAVENOUS
  Administered 2023-06-28: 50 mg via INTRAVENOUS
  Administered 2023-06-28: 30 mg via INTRAVENOUS
  Administered 2023-06-28: 50 mg via INTRAVENOUS

## 2023-06-28 NOTE — Op Note (Signed)
[facility] Patient Name: Sabrina [name] Procedure Date: 06/28/2023 8:08 AM MRN: [id number] Date of Birth: [birth year] Attending MD: Katrinka Blazing , , [id number] CSN: [id number] Age: 52 Admit Type: Outpatient Procedure:                Upper GI endoscopy Indications:              Heartburn, Globus sensation, Sore throat Providers:                Katrinka Blazing, Nena Polio, RN, Kristine L. Jessee Avers, Technician Referring MD:              Medicines:                Monitored Anesthesia Care Complications:            No immediate complications. Estimated Blood Loss:     Estimated blood loss: none. Procedure:                Pre-Anesthesia Assessment:                           - Prior to the procedure, a History and Physical                            was performed, and patient medications, allergies                            and sensitivities were reviewed. The patient's                            tolerance of previous anesthesia was reviewed.                           - The risks and benefits of the procedure and the                            sedation options and risks were discussed with the                            patient. All questions were answered and informed                            consent was obtained.                           - ASA Grade Assessment: II - A patient with mild                            systemic disease.                           After obtaining informed consent, the endoscope was                            passed under direct vision. Throughout  the                            procedure, the patient's blood pressure, pulse, and                            oxygen saturations were monitored continuously. The                            [id number]-H190 ([id number]) scope was introduced through the                            mouth, and advanced to the second part of duodenum.                            The upper GI endoscopy was  accomplished without                            difficulty. The patient tolerated the procedure                            well. Scope In: 8:20:04 AM Scope Out: 8:22:30 AM Total Procedure Duration: 0 hours 2 minutes 26 seconds  Findings:      A 2 cm hiatal hernia was present. There was no presence of other       alterations in the entire esophagus.      The gastroesophageal flap valve was visualized endoscopically and       classified as Hill Grade III (minimal fold, loose to endoscope, hiatal       hernia likely).      The stomach was normal.      The examined duodenum was normal. Impression:               - 2 cm hiatal hernia.                           - Normal stomach.                           - Normal examined duodenum.                           - No specimens collected. Moderate Sedation:      Per Anesthesia Care Recommendation:           - Discharge patient to home (ambulatory).                           - Resume previous diet.                           - Increase omeprazole to 40 mg twice a day - stop                            famotidine.                           - Patient should take  medication in the morning                            30-45 minutes before eating breakfast and supper.                            Avoid eating within 2 hours of lying down to sleep                            and benefit of blocks to elevate head of bed. Also,                            will benefit from avoiding carbonated drinks/sodas                            or food that has tomatoes, spicy or greasy food.                           - If persisting symptoms after 3 months of                            treatment, will need to proceed with Bravo testing                            on PPI.                           - Return to GI clinic as previously scheduled. 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 All rights reserved. The codes documented in this report are preliminary and upon coder review may  be revised to meet current compliance requirements. Katrinka Blazing, MD Katrinka Blazing,  06/28/2023 8:30:32 AM This report has been signed electronically. Number of Addenda: 0

## 2023-06-28 NOTE — Transfer of Care (Signed)
Immediate Anesthesia Transfer of Care Note  Patient: Sabrina [name]  Procedure(s) Performed: ESOPHAGOGASTRODUODENOSCOPY (EGD) WITH PROPOFOL  Patient Location: Endoscopy Unit  Anesthesia Type:General  Level of Consciousness: awake  Airway & Oxygen Therapy: Patient Spontanous Breathing  Post-op Assessment: Report given to RN  Post vital signs: Reviewed and stable  Last Vitals:  Vitals Value Taken Time  BP    Temp    Pulse    Resp    SpO2      Last Pain:  Vitals:   06/28/23 0722  TempSrc: Oral         Complications: No notable events documented.

## 2023-06-28 NOTE — Discharge Instructions (Signed)
You are being discharged to home.  Resume your previous diet.  Increase omeprazole to 40 mg twice a day - stop famotidine.  Patient should take medication in the morning 30-45 minutes before eating breakfast and supper. Avoid eating within 2 hours of lying down to sleep and benefit of blocks to elevate head of bed. Also, will benefit from avoiding carbonated drinks/sodas or food that has tomatoes, spicy or greasy food. If persisting symptoms after 3 months of treatment, will need to proceed with Bravo testing on PPI. Return to GI clinic as previously scheduled.

## 2023-06-28 NOTE — H&P (Signed)
Sabrina [name] is an 52 y.o. female.   Chief Complaint: Chest pain, heartburn and cough HPI: 52 year old female with past medical history of anxiety, GERD, hypothyroidism, coming for evaluation of chest pain, heartburn and cough.  Patient reports that she has had recurrent episodes of burning in her chest despite taking omeprazole 40 mg every day and famotidine at night.  Feels that famotidine has helped some but has not completely suppressed chest pain.  Also has recurrent cough and throat clearing.  Past Medical History:  Diagnosis Date   Anxiety    GERD (gastroesophageal reflux disease)    History of hiatal hernia    History of kidney stones    Hypothyroidism    Hypothyroidism    PONV (postoperative nausea and vomiting)     Past Surgical History:  Procedure Laterality Date   BIOPSY  03/05/2021   Procedure: BIOPSY;  Surgeon: Malissa Hippo, MD;  [facility];  Service: Endoscopy;;  bx of gastric polyps   CESAREAN SECTION  2011   CHOLECYSTECTOMY N/A 09/13/2020   Procedure: LAPAROSCOPIC CHOLECYSTECTOMY;  Surgeon: Franky Macho, MD;  Location: [facility];  Service: General;  Laterality: N/A;   ESOPHAGOGASTRODUODENOSCOPY (EGD) WITH PROPOFOL N/A 03/05/2021   Procedure: ESOPHAGOGASTRODUODENOSCOPY (EGD) WITH PROPOFOL;  Surgeon: Malissa Hippo, MD;  [facility];  Service: Endoscopy;  Laterality: N/A;  AM   EXTRACORPOREAL SHOCK WAVE LITHOTRIPSY Left 04/15/2021   Procedure: EXTRACORPOREAL SHOCK WAVE LITHOTRIPSY (ESWL);  Surgeon: Malen Gauze, MD;  Location: [facility];  Service: Urology;  Laterality: Left;   HERNIA REPAIR  2015   LASIK  2019   LITHOTRIPSY  2017   NASAL SINUS SURGERY     x2   TUBAL LIGATION      Family History  Problem Relation Age of Onset   Cancer Mother    Breast cancer Mother    Heart disease Father    Diabetes Maternal Grandfather    Thyroid disease Paternal Grandmother    Colon cancer Paternal Grandmother    Renal cancer Paternal  Grandmother    Glaucoma Brother    Anxiety disorder Brother    Hypercholesterolemia Brother    Healthy Son    Irritable bowel syndrome Son    Social History:  reports that she has never smoked. She has never been exposed to tobacco smoke. She has never used smokeless tobacco. She reports that she does not drink alcohol and does not use drugs.  Allergies:  Allergies  Allergen Reactions   Erythromycin Nausea And Vomiting   Macrobid [Nitrofurantoin] Palpitations    Medications Prior to Admission  Medication Sig Dispense Refill   busPIRone (BUSPAR) 10 MG tablet Take 10 mg by mouth 2 (two) times daily.     cetirizine (ZYRTEC) 10 MG tablet Take 10 mg by mouth every evening.     desvenlafaxine (PRISTIQ) 50 MG 24 hr tablet Take 50 mg by mouth every evening.     docusate sodium (COLACE) 100 MG capsule Take 200 mg by mouth at bedtime.     eletriptan (RELPAX) 20 MG tablet Take 20 mg by mouth as needed for migraine or headache. May repeat in 2 hours if headache persists or recurs.     ibuprofen (ADVIL) 200 MG tablet Take 800 mg by mouth every 8 (eight) hours as needed (pain.).     ipratropium (ATROVENT) 0.03 % nasal spray Place 2 sprays into both nostrils at bedtime.     levothyroxine (SYNTHROID) 100 MCG tablet Take 1 tablet (100  mcg total) by mouth daily before breakfast. 90 tablet 3   mometasone (NASONEX) 50 MCG/ACT nasal spray Place 1-2 sprays into the nose in the morning.     omeprazole (PRILOSEC) 40 MG capsule Take 1 capsule (40 mg total) by mouth daily. (Patient taking differently: Take 40 mg by mouth every evening.) 90 capsule 3   Selenium 200 MCG CAPS Take 200 mcg by mouth every evening.     valACYclovir (VALTREX) 500 MG tablet Take 500 mg by mouth daily as needed (outbreak).      Results for orders placed or performed during the hospital encounter of 06/28/23 (from the past 48 hour(s))  Pregnancy, urine POC     Status: None   Collection Time: 06/28/23  7:24 AM  Result Value Ref Range    Preg Test, Ur NEGATIVE NEGATIVE    Comment:        THE SENSITIVITY OF THIS METHODOLOGY IS >24 mIU/mL    No results found.  Review of Systems  All other systems reviewed and are negative.   Blood pressure 130/73, pulse 76, temperature 98.6 F (37 C), temperature source Oral, resp. rate 19, last menstrual period 06/01/2023, SpO2 100%. Physical Exam  GENERAL: The patient is AO x3, in no acute distress. HEENT: Head is normocephalic and atraumatic. EOMI are intact. Mouth is well hydrated and without lesions. NECK: Supple. No masses LUNGS: Clear to auscultation. No presence of rhonchi/wheezing/rales. Adequate chest expansion HEART: RRR, normal s1 and s2. ABDOMEN: Soft, nontender, no guarding, no peritoneal signs, and nondistended. BS +. No masses. EXTREMITIES: Without any cyanosis, clubbing, rash, lesions or edema. NEUROLOGIC: AOx3, no focal motor deficit. SKIN: no jaundice, no rashes  Assessment/Plan 52 year old female with past medical history of anxiety, GERD, hypothyroidism, coming for evaluation of chest pain, heartburn and cough.  Will proceed with EGD.  Dolores Frame, MD 06/28/2023, 7:38 AM

## 2023-06-28 NOTE — Anesthesia Postprocedure Evaluation (Signed)
Anesthesia Post Note  Patient: Sabrina [name]  Procedure(s) Performed: ESOPHAGOGASTRODUODENOSCOPY (EGD) WITH PROPOFOL  Patient location during evaluation: Endoscopy Anesthesia Type: General Level of consciousness: awake and alert Pain management: pain level controlled Vital Signs Assessment: post-procedure vital signs reviewed and stable Respiratory status: spontaneous breathing Cardiovascular status: blood pressure returned to baseline and stable Postop Assessment: no apparent nausea or vomiting Anesthetic complications: no   No notable events documented.   Last Vitals:  Vitals:   06/28/23 0722  BP: 130/73  Pulse: 76  Resp: 19  Temp: 37 C  SpO2: 100%    Last Pain:  Vitals:   06/28/23 0722  TempSrc: Oral                 Marchel Foote

## 2023-06-28 NOTE — Anesthesia Preprocedure Evaluation (Signed)
Anesthesia Evaluation  Patient identified by MRN, date of birth, ID band Patient awake    Reviewed: Allergy & Precautions, H&P , NPO status , Patient's Chart, lab work & pertinent test results, reviewed documented beta blocker date and time   History of Anesthesia Complications (+) PONV and history of anesthetic complications  Airway Mallampati: II  TM Distance: >3 FB Neck ROM: full    Dental no notable dental hx.    Pulmonary neg pulmonary ROS   Pulmonary exam normal breath sounds clear to auscultation       Cardiovascular Exercise Tolerance: Good negative cardio ROS  Rhythm:regular Rate:Normal     Neuro/Psych   Anxiety     negative neurological ROS  negative psych ROS   GI/Hepatic negative GI ROS, Neg liver ROS, hiatal hernia,GERD  ,,  Endo/Other  negative endocrine ROSHypothyroidism    Renal/GU negative Renal ROS  negative genitourinary   Musculoskeletal   Abdominal   Peds  Hematology negative hematology ROS (+)   Anesthesia Other Findings   Reproductive/Obstetrics negative OB ROS                             Anesthesia Physical Anesthesia Plan  ASA: 2  Anesthesia Plan: General   Post-op Pain Management:    Induction:   PONV Risk Score and Plan: Propofol infusion  Airway Management Planned:   Additional Equipment:   Intra-op Plan:   Post-operative Plan:   Informed Consent: I have reviewed the patients History and Physical, chart, labs and discussed the procedure including the risks, benefits and alternatives for the proposed anesthesia with the patient or authorized representative who has indicated his/her understanding and acceptance.     Dental Advisory Given  Plan Discussed with: CRNA  Anesthesia Plan Comments:        Anesthesia Quick Evaluation

## 2023-07-02 ENCOUNTER — Encounter (HOSPITAL_COMMUNITY): Payer: Self-pay | Admitting: Gastroenterology

## 2023-09-09 ENCOUNTER — Ambulatory Visit (INDEPENDENT_AMBULATORY_CARE_PROVIDER_SITE_OTHER): Payer: BC Managed Care – PPO | Admitting: Gastroenterology

## 2023-09-27 ENCOUNTER — Ambulatory Visit (INDEPENDENT_AMBULATORY_CARE_PROVIDER_SITE_OTHER): Payer: BC Managed Care – PPO | Admitting: Gastroenterology

## 2023-09-27 ENCOUNTER — Encounter (INDEPENDENT_AMBULATORY_CARE_PROVIDER_SITE_OTHER): Payer: Self-pay | Admitting: Gastroenterology

## 2023-09-27 VITALS — BP 139/80 | HR 76 | Temp 98.0°F | Ht 64.0 in | Wt 173.4 lb

## 2023-09-27 DIAGNOSIS — K589 Irritable bowel syndrome without diarrhea: Secondary | ICD-10-CM

## 2023-09-27 DIAGNOSIS — K582 Mixed irritable bowel syndrome: Secondary | ICD-10-CM

## 2023-09-27 DIAGNOSIS — K219 Gastro-esophageal reflux disease without esophagitis: Secondary | ICD-10-CM

## 2023-09-27 DIAGNOSIS — K2100 Gastro-esophageal reflux disease with esophagitis, without bleeding: Secondary | ICD-10-CM

## 2023-09-27 NOTE — Patient Instructions (Signed)
Please continue omeprazole 40 mg twice daily. Please be mindful of spicy, greasy, citrus/tomato-based foods, caffeine, chocolate, alcohol as these can increase reflux precautions.  Try to avoid eating late and make sure you are staying upright 2 to 3 hours after eating prior to laying down. Increase water intake, aim for atleast 64 oz per day Increase fruits, veggies and whole grains, kiwi and prunes are especially good for constipation You can continue to try regular use of Benefiber 1 tablespoon twice daily with a meal.  Follow-up 6 months  Annaka Cleaver L. Jeanmarie Hubert, MSN, APRN, AGNP-C Adult-Gerontology Nurse Practitioner [facility] at [facility]

## 2023-09-27 NOTE — Progress Notes (Addendum)
Referring Provider: Maximiano Coss Primary Care Physician:  Maximiano Coss, MD Primary GI Physician:   Chief Complaint  Patient presents with   Follow-up    Patient here today for a follow up appointment. Patient denies any current issues.   HPI:   Sabrina [name] is a 52 y.o. female with past medical history of  anxiety, GERD, HH, kidney stones, hypothyroidism.   Patient presenting today for follow up of GERD and IBS  Last seen march 2024, at that time having continued globus sensation, chest pressure/pain. Having to clear throat a lot and coughing. Having some bloating and abdominal cramping. Taking simethicone.   Recommended TIF brochure, EGD, continue omeprazole 40mg  daily, benefiber 1T BID with meals  EGD as outlined below, omeprazole increase to 40mg  BID   Present: Doing better since EGD. Notes globus sensation has improved. Sometimes has issues taking first PPI of the day given her thyroid medication. Denies any further chest pressure. She has heartburn symptoms maybe once per month.appetite is good. No weight loss  Bowel movements are still back and forth. She is doing benefiber but not regularly. She notes she can sometimes has 2-3 BMs per day that are formed stools then may go a few days without a BM but she denies the need to strain or any constipation. No rectal bleeding or melena. Denies abdominal pain. Drinking 2 bottles of water per day.   Last Colonoscopy:2016 dr spainhour, normal   Last Endoscopy:- 2 cm hiatal hernia.                           - Normal stomach.                           - Normal examined duodenum.                           - No specimens collected.  Recommendations:  Repeat Colonoscopy 2026  Past Medical History:  Diagnosis Date   Anxiety    GERD (gastroesophageal reflux disease)    History of hiatal hernia    History of kidney stones    Hypothyroidism    Hypothyroidism    PONV (postoperative nausea and vomiting)      Past Surgical History:  Procedure Laterality Date   BIOPSY  03/05/2021   Procedure: BIOPSY;  Surgeon: Malissa Hippo, MD;  [facility];  Service: Endoscopy;;  bx of gastric polyps   CESAREAN SECTION  2011   CHOLECYSTECTOMY N/A 09/13/2020   Procedure: LAPAROSCOPIC CHOLECYSTECTOMY;  Surgeon: Franky Macho, MD;  Location: [facility] ORS;  Service: General;  Laterality: N/A;   ESOPHAGOGASTRODUODENOSCOPY (EGD) WITH PROPOFOL N/A 03/05/2021   Procedure: ESOPHAGOGASTRODUODENOSCOPY (EGD) WITH PROPOFOL;  Surgeon: Malissa Hippo, MD;  [facility];  Service: Endoscopy;  Laterality: N/A;  AM   ESOPHAGOGASTRODUODENOSCOPY (EGD) WITH PROPOFOL N/A 06/28/2023   Procedure: ESOPHAGOGASTRODUODENOSCOPY (EGD) WITH PROPOFOL;  Surgeon: Dolores Frame, MD;  [facility];  Service: Gastroenterology;  Laterality: N/A;  8:15AM;ASA 2   EXTRACORPOREAL SHOCK WAVE LITHOTRIPSY Left 04/15/2021   Procedure: EXTRACORPOREAL SHOCK WAVE LITHOTRIPSY (ESWL);  Surgeon: Malen Gauze, MD;  Location: [facility];  Service: Urology;  Laterality: Left;   HERNIA REPAIR  2015   LASIK  2019   LITHOTRIPSY  2017   NASAL SINUS SURGERY     x2   TUBAL  LIGATION      Current Outpatient Medications  Medication Sig Dispense Refill   busPIRone (BUSPAR) 10 MG tablet Take 10 mg by mouth 2 (two) times daily.     cetirizine (ZYRTEC) 10 MG tablet Take 10 mg by mouth every evening.     desvenlafaxine (PRISTIQ) 50 MG 24 hr tablet Take 50 mg by mouth every evening.     docusate sodium (COLACE) 100 MG capsule Take 200 mg by mouth at bedtime.     eletriptan (RELPAX) 20 MG tablet Take 20 mg by mouth as needed for migraine or headache. May repeat in 2 hours if headache persists or recurs.     ibuprofen (ADVIL) 200 MG tablet Take 800 mg by mouth every 8 (eight) hours as needed (pain.).     ipratropium (ATROVENT) 0.03 % nasal spray Place 2 sprays into both nostrils at bedtime.     levothyroxine (SYNTHROID) 100 MCG  tablet Take 1 tablet (100 mcg total) by mouth daily before breakfast. 90 tablet 3   mometasone (NASONEX) 50 MCG/ACT nasal spray Place 1-2 sprays into the nose in the morning.     omeprazole (PRILOSEC) 40 MG capsule Take 1 capsule (40 mg total) by mouth 2 (two) times daily. 180 capsule 1   Selenium 200 MCG CAPS Take 200 mcg by mouth every evening.     valACYclovir (VALTREX) 500 MG tablet Take 500 mg by mouth daily as needed (outbreak).     No current facility-administered medications for this visit.    Allergies as of 09/27/2023 - Review Complete 09/27/2023  Allergen Reaction Noted   Erythromycin Nausea And Vomiting 09/11/2013   Macrobid [nitrofurantoin] Palpitations 09/11/2013    Family History  Problem Relation Age of Onset   Cancer Mother    Breast cancer Mother    Heart disease Father    Diabetes Maternal Grandfather    Thyroid disease Paternal Grandmother    Colon cancer Paternal Grandmother    Renal cancer Paternal Grandmother    Glaucoma Brother    Anxiety disorder Brother    Hypercholesterolemia Brother    Healthy Son    Irritable bowel syndrome Son     Social History   Socioeconomic History   Marital status: Married    Spouse name: Not on file   Number of children: 2   Years of education: Not on file   Highest education level: Not on file  Occupational History   Occupation: Print production planner  Tobacco Use   Smoking status: Never    Passive exposure: Never   Smokeless tobacco: Never  Vaping Use   Vaping status: Never Used  Substance and Sexual Activity   Alcohol use: Never   Drug use: Never   Sexual activity: Yes  Other Topics Concern   Not on file  Social History Narrative   ** Merged History Encounter **       She has 1 adopted child, 51 years old in 09/2020   Social Determinants of Health   Financial Resource Strain: Not on file  Food Insecurity: Not on file  Transportation Needs: Not on file  Physical Activity: Not on file  Stress: Not on file   Social Connections: Not on file    Review of systems General: negative for malaise, night sweats, fever, chills, weight loss Neck: Negative for lumps, goiter, pain and significant neck swelling Resp: Negative for cough, wheezing, dyspnea at rest CV: Negative for chest pain, leg swelling, palpitations, orthopnea GI: denies melena, hematochezia, nausea, vomiting, diarrhea, constipation, dysphagia,  odyonophagia, early satiety or unintentional weight loss.  MSK: Negative for joint pain or swelling, back pain, and muscle pain. Derm: Negative for itching or rash Psych: Denies depression, anxiety, memory loss, confusion. No homicidal or suicidal ideation.  Heme: Negative for prolonged bleeding, bruising easily, and swollen nodes. Endocrine: Negative for cold or heat intolerance, polyuria, polydipsia and goiter. Neuro: negative for tremor, gait imbalance, syncope and seizures. The remainder of the review of systems is noncontributory.  Physical Exam: BP 139/80 (BP Location: Left Arm, Patient Position: Sitting, Cuff Size: Normal)   Pulse 76   Temp 98 F (36.7 C) (Temporal)   Ht 5\' 4"  (1.626 m)   Wt 173 lb 6.4 oz (78.7 kg)   BMI 29.76 kg/m  General:   Alert and oriented. No distress noted. Pleasant and cooperative.  Head:  Normocephalic and atraumatic. Eyes:  Conjuctiva clear without scleral icterus. Mouth:  Oral mucosa pink and moist. Good dentition. No lesions. Heart: Normal rate and rhythm, s1 and s2 heart sounds present.  Lungs: Clear lung sounds in all lobes. Respirations equal and unlabored. Abdomen:  +BS, soft, non-tender and non-distended. No rebound or guarding. No HSM or masses noted. Neurologic:  Alert and  oriented x4 Psych:  Alert and cooperative. Normal mood and affect.  Invalid input(s): "6 MONTHS"   ASSESSMENT: Merdith Claes is a 52 y.o. female presenting today for follow up of GERD and IBS  GERD: Previously with more chest pressure and globus sensation which has  improved with omeprazole 40 mg twice daily.  She has very occasional breakthrough heartburn maybe once per month.  Notes she symptoms forgets to take her first dose of the day due to spacing with her thyroid medication.  I encouraged her to try and take PPI twice daily, she can send an alarm on her phone to help remember the first dose of the day if this will pose an issue.  Should continue with good reflux precautions. She did inquire about safety of PPI therapy, Patient was re-assured regarding PPI use and safety of when appropriate indications in question. Most recent studies on PPI therapy that association of symptoms is not equivalent to causation and overall association with for example osteoporosis is weak and based on observational studies. When PPI use is indicated, it is safe to proceed with therapy and titrate dosing/use based on symptom response.  IBS: Can have 2-3 BMs per day, sometimes go 2 to 3 days without a bowel movement though she denies any constipation or diarrhea.  Denies any abdominal pain, rectal bleeding, melena.  Is not doing Benefiber regularly.  Encouraged to try doing 1 to 2 tablespoons of Benefiber daily, Increase water intake, aim for atleast 64 oz per day Increase fruits, veggies and whole grains, kiwi and prunes are especially good for constipation    PLAN:  Continue with omeprazole 40mg  BID  2. Reflux precautions  3. Benefiber 1T BID 4. Increase water intake, aim for atleast 64 oz per day Increase fruits, veggies and whole grains, kiwi and prunes are especially good for constipation  All questions were answered, patient verbalized understanding and is in agreement with plan as outlined above.   Follow Up: 6 months   Sabrina [name] L. Jeanmarie Hubert, MSN, APRN, AGNP-C Adult-Gerontology Nurse Practitioner [facility]  I have reviewed the note and agree with the APP's assessment as described in this progress note  Katrinka Blazing, MD Gastroenterology  and Hepatology [facility]astroenterology

## 2024-01-06 ENCOUNTER — Ambulatory Visit: Payer: BC Managed Care – PPO | Admitting: Internal Medicine

## 2024-01-06 ENCOUNTER — Encounter: Payer: Self-pay | Admitting: Internal Medicine

## 2024-01-06 VITALS — BP 116/60 | HR 76 | Ht 64.0 in | Wt 172.6 lb

## 2024-01-06 DIAGNOSIS — Z8639 Personal history of other endocrine, nutritional and metabolic disease: Secondary | ICD-10-CM | POA: Diagnosis not present

## 2024-01-06 DIAGNOSIS — E063 Autoimmune thyroiditis: Secondary | ICD-10-CM | POA: Diagnosis not present

## 2024-01-06 LAB — T4, FREE: Free T4: 1.5 ng/dL (ref 0.8–1.8)

## 2024-01-06 LAB — TSH: TSH: 1.03 m[IU]/L

## 2024-01-06 MED ORDER — LEVOTHYROXINE SODIUM 100 MCG PO TABS: 100.0000 ug | ORAL_TABLET | Freq: Every day | ORAL | 3 refills | Status: AC

## 2024-01-06 NOTE — Patient Instructions (Signed)
  Please continue Levothyroxine 100 mcg daily.  Take the thyroid hormone every day, with water, at least 30 minutes before breakfast, separated by at least 4 hours from: - acid reflux medications - calcium - iron - multivitamins  Please stop at the lab.  Please come back for a follow-up appointment in 1 year. 

## 2024-01-06 NOTE — Addendum Note (Signed)
 Addended by: Emilie Harden on: 01/06/2024 04:40 PM   Modules accepted: Orders

## 2024-01-06 NOTE — Progress Notes (Addendum)
 Patient ID: Sabrina [name], female   DOB: March 14, 1971, 53 y.o.   MRN: [id number]   HPI  Sabrina [name] is a 53 y.o.-year-old female, initially referred by her PCP, Dr. Donnise (PCP in [place]), returning for follow-up for Hashimoto's hypothyroidism.  She also has a history of thyroid  nodule.  She previously saw several endocrinologists.  Last visit with me 1 year ago. She lives in [place].  She sees Dr. Mat.    Interim history: She feels well, with occasional mental fog, joint pains, and fatigue, but no significant weight changes. She is perimenopausal. She Just had uterine ablation for long, heavy cycles.   Hypothyroidism: - dx'ed in 2014, during investigation for fatigue. At that time, she had thyroid  imaging and was found to have thyroid  nodules.  She is on levothyroxine  100 mcg: - fasting - with water  - separated by >30 min from b'fast  - no calcium, iron - stopped multivitamins later in the day - + prev. on Protonix 40 mg daily at bedtime >> changed to Dexilant  30 min after LT4  (!) >> moved to bedtime >> then stopped - on Prevacid at night >> now Omeprazole  at night. - on Super B complex (400 mcg biotin) - stopped before last visit.  I reviewed pt's thyroid  tests: Lab Results  Component Value Date   TSH 0.57 12/31/2022   TSH 0.45 12/25/2021   TSH 1.030 05/15/2021   TSH 0.87 10/10/2020   FREET4 0.99 12/31/2022   FREET4 1.12 12/25/2021   FREET4 1.33 05/15/2021   FREET4 1.19 10/10/2020   T3FREE 2.6 05/15/2021  07/04/2020: TSH 1.27 03/21/2020: TSH 1.06, free T4 1.4 02/23/2020: TSH 1.34 (0.45-4.5), free T4 1.37, total T3 101 04/10/2014: TSH 0.86 (0.34-5.66), free T4 1.12 (0.52-1.21)  Antithyroid antibodies: Component     Latest Ref Rng & Units 12/25/2021  Thyroglobulin Ab     < or = 1 IU/mL 1  Thyroperoxidase Ab SerPl-aCnc     <9 IU/mL 123 (H)   Component     Latest Ref Rng & Units 10/10/2020 05/15/2021  Thyroglobulin Ab     < or = 1 IU/mL 2 (H)    Thyroperoxidase Ab SerPl-aCnc     0 - 34 IU/mL 86 (H) 128 (H)  Thyroglobulin Antibody     0.0 - 0.9 IU/mL  1.4 (H)  03/21/2020: TPO antibodies 169 02/23/2020: TPO antibodies 190 (0-34) No components found for: TPOAB   We started selenium 200 mcg daily in 09/2020.  She started to feel much better after starting this.  She previously described: - weight gain - fatigue - cold intolerance - depression - constipation - dry skin - hair loss The symptoms improved.  Thyroid  nodule:  Reviewed previous investigation: Thyroid  ultrasound (02/2013): 1.7 x 1.03 cm right mid thyroid  nodule Thyroid  ultrasound in office-Dr. Esclamado (08/2013): No obvious thyroid  nodule Thyroid  ultrasound (04/10/2014): Heterogeneous, thyroid  lobes, without thyroid  nodules.  Small lymph nodes, benign-appearing.  Thyroid  ultrasound (02/20/2020): Heterogeneous thyroid  with increased vascularity throughout the entire gland, no nodules  Pt denies: - feeling nodules in neck - dysphagia - choking - SOB with lying down She described increased fullness in her neck and also hoarseness after having had COVID-19 in 01/2020.  Interestingly, the fullness in her neck improved after gallbladder surgery 08/2020.  She only occasionally feels some mild pressure in her neck now.  She has + FH of thyroid  disorders in: PGM. 3 great aunts with thyroid  ds. No FH of thyroid  cancer.  No h/o radiation tx to  head or neck. No recent use of iodine supplements.  She has a h/o 2 miscarriages. She had + ANA.  She saw rheumatology in the past. She has a history of kidney stones-status post ESWL.  ROS: + See HPI  Past Medical History:  Diagnosis Date   Anxiety    GERD (gastroesophageal reflux disease)    History of hiatal hernia    History of kidney stones    Hypothyroidism    Hypothyroidism    PONV (postoperative nausea and vomiting)    Past Surgical History:  Procedure Laterality Date   BIOPSY  03/05/2021   Procedure: BIOPSY;   Surgeon: Golda Claudis PENNER, MD;  [facility];  Service: Endoscopy;;  bx of gastric polyps   CESAREAN SECTION  2011   CHOLECYSTECTOMY N/A 09/13/2020   Procedure: LAPAROSCOPIC CHOLECYSTECTOMY;  Surgeon: Mavis Anes, MD;  Location: [facility];  Service: General;  Laterality: N/A;   ESOPHAGOGASTRODUODENOSCOPY (EGD) WITH PROPOFOL  N/A 03/05/2021   Procedure: ESOPHAGOGASTRODUODENOSCOPY (EGD) WITH PROPOFOL ;  Surgeon: Golda Claudis PENNER, MD;  [facility];  Service: Endoscopy;  Laterality: N/A;  AM   ESOPHAGOGASTRODUODENOSCOPY (EGD) WITH PROPOFOL  N/A 06/28/2023   Procedure: ESOPHAGOGASTRODUODENOSCOPY (EGD) WITH PROPOFOL ;  Surgeon: Eartha Angelia Sieving, MD;  [facility];  Service: Gastroenterology;  Laterality: N/A;  8:15AM;ASA 2   EXTRACORPOREAL SHOCK WAVE LITHOTRIPSY Left 04/15/2021   Procedure: EXTRACORPOREAL SHOCK WAVE LITHOTRIPSY (ESWL);  Surgeon: Sherrilee Belvie CROME, MD;  Location: [facility];  Service: Urology;  Laterality: Left;   HERNIA REPAIR  2015   LASIK  2019   LITHOTRIPSY  2017   NASAL SINUS SURGERY     x2   TUBAL LIGATION     Social History   Socioeconomic History   Marital status: Married    Spouse name: Not on file   Number of children: 2   Years of education: Not on file   Highest education level: Not on file  Occupational History   Occupation: Print production planner  Tobacco Use   Smoking status: Never    Passive exposure: Never   Smokeless tobacco: Never  Vaping Use   Vaping status: Never Used  Substance and Sexual Activity   Alcohol use: Never   Drug use: Never   Sexual activity: Yes  Other Topics Concern   Not on file  Social History Narrative   ** Merged History Encounter **       She has 1 adopted child, 21 years old in 09/2020   Social Drivers of Corporate Investment Banker Strain: Not on file  Food Insecurity: Not on file  Transportation Needs: Not on file  Physical Activity: Not on file  Stress: Not on file  Social Connections:  Not on file  Intimate Partner Violence: Not on file   Current Outpatient Medications on File Prior to Visit  Medication Sig Dispense Refill   busPIRone (BUSPAR) 10 MG tablet Take 10 mg by mouth 2 (two) times daily.     cetirizine (ZYRTEC) 10 MG tablet Take 10 mg by mouth every evening.     desvenlafaxine (PRISTIQ) 50 MG 24 hr tablet Take 50 mg by mouth every evening.     docusate sodium  (COLACE) 100 MG capsule Take 200 mg by mouth at bedtime.     eletriptan (RELPAX) 20 MG tablet Take 20 mg by mouth as needed for migraine or headache. May repeat in 2 hours if headache persists or recurs.     ibuprofen (ADVIL) 200 MG tablet Take 800  mg by mouth every 8 (eight) hours as needed (pain.).     ipratropium (ATROVENT) 0.03 % nasal spray Place 2 sprays into both nostrils at bedtime.     levothyroxine  (SYNTHROID ) 100 MCG tablet Take 1 tablet (100 mcg total) by mouth daily before breakfast. 90 tablet 3   mometasone (NASONEX) 50 MCG/ACT nasal spray Place 1-2 sprays into the nose in the morning.     omeprazole  (PRILOSEC) 40 MG capsule Take 1 capsule (40 mg total) by mouth 2 (two) times daily. 180 capsule 1   Selenium 200 MCG CAPS Take 200 mcg by mouth every evening.     valACYclovir (VALTREX) 500 MG tablet Take 500 mg by mouth daily as needed (outbreak).     No current facility-administered medications on file prior to visit.   Allergies  Allergen Reactions   Erythromycin Nausea And Vomiting   Macrobid [Nitrofurantoin] Palpitations   Family History  Problem Relation Age of Onset   Cancer Mother    Breast cancer Mother    Heart disease Father    Diabetes Maternal Grandfather    Thyroid  disease Paternal Grandmother    Colon cancer Paternal Grandmother    Renal cancer Paternal Grandmother    Glaucoma Brother    Anxiety disorder Brother    Hypercholesterolemia Brother    Healthy Son    Irritable bowel syndrome Son    + See HPI  PE: BP 116/60   Pulse 76   Ht 5' 4 (1.626 m)   Wt 172 lb 9.6  oz (78.3 kg)   SpO2 98%   BMI 29.63 kg/m  Wt Readings from Last 10 Encounters:  01/06/24 172 lb 9.6 oz (78.3 kg)  09/27/23 173 lb 6.4 oz (78.7 kg)  02/18/23 168 lb 1.6 oz (76.2 kg)  12/31/22 170 lb (77.1 kg)  12/10/22 168 lb (76.2 kg)  12/25/21 166 lb 12.8 oz (75.7 kg)  04/10/21 164 lb 9.6 oz (74.7 kg)  03/03/21 165 lb (74.8 kg)  02/18/21 165 lb 1.6 oz (74.9 kg)  11/13/20 166 lb (75.3 kg)    Constitutional: Normal weight, in NAD Eyes:  EOMI, no exophthalmos ENT: + slight symmetric thyromegaly, no cervical lymphadenopathy Cardiovascular: RRR, No MRG Respiratory: CTA B Musculoskeletal: no deformities Skin: no rashes Neurological: no tremor with outstretched hands  ASSESSMENT: 1.  Hashimoto's hypothyroidism  2.  History of thyroid  nodules  PLAN:  1. Patient with long history of controlled autoimmune hypothyroidism, on levothyroxine  and also selenium 200 mcg daily - latest thyroid  labs reviewed with pt. >> normal: Lab Results  Component Value Date   TSH 0.57 12/31/2022  - she continues on LT4 100 mcg daily - pt feels good on this dose.  She has some fatigue, joint pains, and mental fog, but she wonders whether these are not related to perimenopause, stress, ageing. - we discussed about taking the thyroid  hormone every day, with water , >30 minutes before breakfast, separated by >4 hours from acid reflux medications, calcium, iron, multivitamins. Pt. is taking it correctly.  She was previously taking Dexilant  only 30 minutes after levothyroxine  so removed it later in the day.  However, she is now  on omeprazole  taken at night. - will check thyroid  tests today: TSH and fT4 - If labs are abnormal, she will need to return for repeat TFTs in 1.5 months - OTW, I will see her back in a year  2.  History of thyroid  nodules -She has a history of thyroid  enlargement after her COVID-19 infection but this  improved in 2021. -She had a right mid thyroid  dominant nodule on her initial  ultrasound from 02/2013 but she had 3 subsequent ultrasounds including in 2021 showing only signs of inflammation, as expected in the setting of Hashimoto's thyroiditis, but not clearly the limited nodules. -She has no neck compression symptoms for mild intermittent neck pressure. -No further imaging is needed for this, we will continue to monitor her clinically  Needs refills.  Component     Latest Ref Rng 01/06/2024  TSH     mIU/L 1.03   T4,Free(Direct)     0.8 - 1.8 ng/dL 1.5   TFTs are normal.  Lela Fendt, MD PhD [facility] Endocrinology

## 2024-03-30 ENCOUNTER — Ambulatory Visit (INDEPENDENT_AMBULATORY_CARE_PROVIDER_SITE_OTHER): Payer: BC Managed Care – PPO | Admitting: Gastroenterology

## 2024-03-30 VITALS — BP 115/75 | HR 76 | Temp 97.8°F | Ht 64.0 in | Wt 171.0 lb

## 2024-03-30 DIAGNOSIS — K2100 Gastro-esophageal reflux disease with esophagitis, without bleeding: Secondary | ICD-10-CM

## 2024-03-30 DIAGNOSIS — K219 Gastro-esophageal reflux disease without esophagitis: Secondary | ICD-10-CM

## 2024-03-30 DIAGNOSIS — K582 Mixed irritable bowel syndrome: Secondary | ICD-10-CM

## 2024-03-30 NOTE — Progress Notes (Signed)
 Referring Provider: Huston Maiers Primary Care Physician:  Huston Maiers, MD Primary GI Physician: Dr. Sammi Crick   Chief Complaint  Patient presents with   Irritable Bowel Syndrome    Follow up on IBS. Has some abdominal pain.   Gastroesophageal Reflux    Follow up on GERD. Takes omeprazole . Has a hard time taking morning dose due to thyroid  medication. Does have some flare ups.    HPI:   Sabrina [name] is a 53 y.o. female with past medical history of anxiety, GERD, HH, kidney stones, hypothyroidism.   Patient presenting today for:  Follow up of GERD and IBS  Last seen October 2024, at that time, doing better since EGD. Globus sensation improved. Having issues taking first PPI dose given thyroid  mediation timing. No further chest pressure, heartburn on occasion. BMs back and forth. Using benefiber PRN.   Recommended continue omeprazole  40mg  BID, good reflux precautions, benefiber 1T BID  Present:  Still having some issues taking PPI BID due to thyroid  medication, taking morning dose around 8 or 9, sometimes forgets though. She takes second dose around bedtime. Most breakthrough symptoms are occurring in early afternoon, occasionally at night. She note discomfort/burning in her chest for which she will take mylanta for. She may have symptoms for a few days in a row then will eat a more bland diet and this will resolve. Notes symptoms occurring maybe twice per month, usually if she has been eating more trigger foods. No dysphagia or odynophagia.   BMs are back and forth. May have a few good weeks where she will have 1-2 BMs per day then will have a few weeks where she goes 3-4 times per day. States when she took benefiber daily it made her belly hurt. She was doing 1 packet per day. She reports she had some lower abdominal pain when this occurred, she also feels somewhat gassy and has some bloating on occasion. She feels more bloated when constipated, notes bloating  worse with constipation, may go 2-3 days without a BM and will take miralax PRN which helps. She is taking colace 2 pills nightly.   She had uterine ablation in January and notes more stomach issues since this.    Last Colonoscopy:2016 dr spainhour, normal  Last Endoscopy:- 2 cm hiatal hernia.                           - Normal stomach.                           - Normal examined duodenum.                           - No specimens collected.   Recommendations:  Repeat Colonoscopy 2026 Filed Weights   03/30/24 0818  Weight: 171 lb (77.6 kg)     Past Medical History:  Diagnosis Date   Anxiety    GERD (gastroesophageal reflux disease)    History of hiatal hernia    History of kidney stones    Hypothyroidism    Hypothyroidism    PONV (postoperative nausea and vomiting)     Past Surgical History:  Procedure Laterality Date   BIOPSY  03/05/2021   Procedure: BIOPSY;  Surgeon: Ruby Corporal, MD;  [facility];  Service: Endoscopy;;  bx of gastric polyps   CESAREAN SECTION  2011  CHOLECYSTECTOMY N/A 09/13/2020   Procedure: LAPAROSCOPIC CHOLECYSTECTOMY;  Surgeon: Alanda Allegra, MD;  Location: [facility];  Service: General;  Laterality: N/A;   ESOPHAGOGASTRODUODENOSCOPY (EGD) WITH PROPOFOL  N/A 03/05/2021   Procedure: ESOPHAGOGASTRODUODENOSCOPY (EGD) WITH PROPOFOL ;  Surgeon: Ruby Corporal, MD;  [facility];  Service: Endoscopy;  Laterality: N/A;  AM   ESOPHAGOGASTRODUODENOSCOPY (EGD) WITH PROPOFOL  N/A 06/28/2023   Procedure: ESOPHAGOGASTRODUODENOSCOPY (EGD) WITH PROPOFOL ;  Surgeon: Urban Garden, MD;  [facility];  Service: Gastroenterology;  Laterality: N/A;  8:15AM;ASA 2   EXTRACORPOREAL SHOCK WAVE LITHOTRIPSY Left 04/15/2021   Procedure: EXTRACORPOREAL SHOCK WAVE LITHOTRIPSY (ESWL);  Surgeon: Marco Severs, MD;  Location: [facility];  Service: Urology;  Laterality: Left;   HERNIA REPAIR  2015   LASIK  2019   LITHOTRIPSY  2017   NASAL  SINUS SURGERY     x2   TUBAL LIGATION      Current Outpatient Medications  Medication Sig Dispense Refill   busPIRone (BUSPAR) 10 MG tablet Take 10 mg by mouth 2 (two) times daily.     cetirizine (ZYRTEC) 10 MG tablet Take 10 mg by mouth every evening.     desvenlafaxine (PRISTIQ) 50 MG 24 hr tablet Take 50 mg by mouth every evening.     docusate sodium  (COLACE) 100 MG capsule Take 200 mg by mouth at bedtime.     eletriptan (RELPAX) 20 MG tablet Take 20 mg by mouth as needed for migraine or headache. May repeat in 2 hours if headache persists or recurs.     ibuprofen (ADVIL) 200 MG tablet Take 800 mg by mouth every 8 (eight) hours as needed (pain.).     ipratropium (ATROVENT) 0.03 % nasal spray Place 2 sprays into both nostrils at bedtime.     levothyroxine  (SYNTHROID ) 100 MCG tablet Take 1 tablet (100 mcg total) by mouth daily before breakfast. 90 tablet 3   mometasone (NASONEX) 50 MCG/ACT nasal spray Place 1-2 sprays into the nose in the morning.     omeprazole  (PRILOSEC) 40 MG capsule Take 1 capsule (40 mg total) by mouth 2 (two) times daily. 180 capsule 1   Selenium 200 MCG CAPS Take 200 mcg by mouth every evening.     valACYclovir (VALTREX) 500 MG tablet Take 500 mg by mouth daily as needed (outbreak).     No current facility-administered medications for this visit.    Allergies as of 03/30/2024 - Review Complete 03/30/2024  Allergen Reaction Noted   Erythromycin Nausea And Vomiting 09/11/2013   Macrobid [nitrofurantoin] Palpitations 09/11/2013    Social History   Socioeconomic History   Marital status: Married    Spouse name: Not on file   Number of children: 2   Years of education: Not on file   Highest education level: Not on file  Occupational History   Occupation: Print production planner  Tobacco Use   Smoking status: Never    Passive exposure: Never   Smokeless tobacco: Never  Vaping Use   Vaping status: Never Used  Substance and Sexual Activity   Alcohol use: Never    Drug use: Never   Sexual activity: Yes  Other Topics Concern   Not on file  Social History Narrative   ** Merged History Encounter **       She has 1 adopted child, 8 years old in 09/2020   Social Drivers of Health   Financial Resource Strain: Not on file  Food Insecurity: Not on file  Transportation Needs: Not on  file  Physical Activity: Not on file  Stress: Not on file  Social Connections: Not on file    Review of systems General: negative for malaise, night sweats, fever, chills, weight loss Neck: Negative for lumps, goiter, pain and significant neck swelling Resp: Negative for cough, wheezing, dyspnea at rest CV: Negative for chest pain, leg swelling, palpitations, orthopnea GI: denies melena, hematochezia, nausea, vomiting, dysphagia, odyonophagia, early satiety or unintentional weight loss. +reflux symptoms +fluctuating bowel habits +lower abdominal pain The remainder of the review of systems is noncontributory.  Physical Exam: BP 115/75   Pulse 76   Temp 97.8 F (36.6 C) (Oral)   Ht 5\' 4"  (1.626 m)   Wt 171 lb (77.6 kg)   BMI 29.35 kg/m  General:   Alert and oriented. No distress noted. Pleasant and cooperative.  Head:  Normocephalic and atraumatic. Eyes:  Conjuctiva clear without scleral icterus. Mouth:  Oral mucosa pink and moist. Good dentition. No lesions. Heart: Normal rate and rhythm, s1 and s2 heart sounds present.  Lungs: Clear lung sounds in all lobes. Respirations equal and unlabored. Abdomen:  +BS, soft, non-tender and non-distended. No rebound or guarding. No HSM or masses noted. Neurologic:  Alert and  oriented x4 Psych:  Alert and cooperative. Normal mood and affect.  Invalid input(s): "6 MONTHS"   ASSESSMENT: Journii Vereen is a 53 y.o. female presenting today for follow up of GERD and IBS  GERD: mostly well managed on omeprazole  40mg  BID, though she finds taking both doses sometimes is hard due to timing of thyroid  medication. Notes  breakthrough about twice per month, usually after she has been eating more trigger foods such as [place], can take mylanta and eat a bland diet with resolution of symptoms. At this time I encouraged her to really watch her diet and be mindful of trigger foods as well as try to be consistent with BID dosing of her omeprazole . She can use famotidine PRN for breakthrough, if she continues to have breakthrough or this becomes more frequent, could discuss change in therapy, potentially to voquezna.   IBS: continues to fluctuate between constipation and more looser stools. Sometimes seems to depend on the time of the month in relation to her menstrual cycle. Taking stool softener x2 at night. Took benefiber daily for a while after last OV but had more abdominal pain, unsure if this was secondary to benefiber or the uterine ablation she had as she feels she has had more abdominal discomfort since ablation. Recommend she try benefiber again, dosing every other day, if she has worsening abdominal pain with this, she should stop it. We also discussed trial of bentyl to see if this helps her abdominal discomfort, at this time she wants to hold off and will let me know if she wants to try this. Advised her to try a daily probiotic as this may also be beneficial for her.    PLAN:  -continue omeprazole , try to be more diligent with BID dosing -strict reflux precautions -famotidine 20mg  PRN for breakthrough -start daily probiotic -Increase water  intake, aim for atleast 64 oz per day -Increase fruits, veggies and whole grains, kiwi and prunes are especially good for constipation -pt to let me know if she wishes to try bentyl -consider voquezna if reflux symptoms persists   All questions were answered, patient verbalized understanding and is in agreement with plan as outlined above.   Follow Up: 6 months   Vian Fluegel L. Audreyanna Butkiewicz, MSN, APRN, AGNP-C Adult-Gerontology Nurse Practitioner [facility]  I have reviewed the note and agree with the APP's assessment as described in this progress note  Samantha Cress, MD Gastroenterology and Hepatology [facility]

## 2024-03-30 NOTE — Patient Instructions (Signed)
-  continue omeprazole  40mg  twice daily  -strict reflux precautions to include being mindful of greasy, spicy, tomato/citrus based foods, caffeine, chocolate and coffee, staying upright 2-3 hours after eating prior to laying down and avoiding eating late  -can use famotidine 20mg  as needed for breakthrough reflux symptoms  -start daily probiotic -Increase water  intake, aim for atleast 64 oz per day -Increase fruits, veggies and whole grains, kiwi and prunes are especially good for constipation -let me know if you would like to try anti spasmodic for abdominal pain that we discussed  Follow up 6 months  It was a pleasure to see you today. I want to create trusting relationships with patients and provide genuine, compassionate, and quality care. I truly value your feedback! please be on the lookout for a survey regarding your visit with me today. I appreciate your input about our visit and your time in completing this!    Kutler Vanvranken L. Draylen Lobue, MSN, APRN, AGNP-C Adult-Gerontology Nurse Practitioner [facility]

## 2024-06-29 ENCOUNTER — Other Ambulatory Visit (INDEPENDENT_AMBULATORY_CARE_PROVIDER_SITE_OTHER): Payer: Self-pay | Admitting: Gastroenterology

## 2024-06-29 NOTE — Addendum Note (Signed)
 Addended by: ANN VELERIA SAUNDERS on: 06/29/2024 11:09 AM   Modules accepted: Orders

## 2024-08-04 ENCOUNTER — Encounter (INDEPENDENT_AMBULATORY_CARE_PROVIDER_SITE_OTHER): Payer: Self-pay | Admitting: Gastroenterology

## 2024-08-18 ENCOUNTER — Ambulatory Visit: Admitting: Urology

## 2024-09-13 ENCOUNTER — Encounter (INDEPENDENT_AMBULATORY_CARE_PROVIDER_SITE_OTHER): Payer: Self-pay | Admitting: Gastroenterology

## 2024-10-05 ENCOUNTER — Ambulatory Visit (INDEPENDENT_AMBULATORY_CARE_PROVIDER_SITE_OTHER): Admitting: Gastroenterology

## 2024-11-16 ENCOUNTER — Other Ambulatory Visit: Payer: Self-pay | Admitting: Obstetrics and Gynecology

## 2024-11-16 DIAGNOSIS — E049 Nontoxic goiter, unspecified: Secondary | ICD-10-CM

## 2024-11-21 ENCOUNTER — Other Ambulatory Visit: Payer: Self-pay | Admitting: Obstetrics and Gynecology

## 2024-11-21 DIAGNOSIS — R928 Other abnormal and inconclusive findings on diagnostic imaging of breast: Secondary | ICD-10-CM

## 2024-11-27 ENCOUNTER — Telehealth: Payer: Self-pay | Admitting: Urology

## 2024-11-27 ENCOUNTER — Other Ambulatory Visit: Payer: Self-pay

## 2024-11-27 DIAGNOSIS — N200 Calculus of kidney: Secondary | ICD-10-CM

## 2024-11-27 NOTE — Telephone Encounter (Signed)
 Order was canceled by radiology-unable to schedule, new US  order placed.

## 2024-11-27 NOTE — Telephone Encounter (Signed)
 Had a 2 year ultrasound order and it is expired, please send new order.

## 2024-11-28 ENCOUNTER — Ambulatory Visit (HOSPITAL_COMMUNITY)
Admission: RE | Admit: 2024-11-28 | Discharge: 2024-11-28 | Disposition: A | Discharge status: Home / Self Care | Source: Ambulatory Visit | Attending: Urology | Admitting: Urology

## 2024-11-28 DIAGNOSIS — N200 Calculus of kidney: Secondary | ICD-10-CM | POA: Diagnosis present

## 2024-12-01 ENCOUNTER — Ambulatory Visit
Admission: RE | Admit: 2024-12-01 | Discharge: 2024-12-01 | Disposition: A | Discharge status: Home / Self Care | Source: Ambulatory Visit | Attending: Obstetrics and Gynecology | Admitting: Obstetrics and Gynecology

## 2024-12-01 DIAGNOSIS — E049 Nontoxic goiter, unspecified: Secondary | ICD-10-CM

## 2024-12-06 ENCOUNTER — Encounter: Payer: Self-pay | Admitting: Internal Medicine

## 2024-12-06 ENCOUNTER — Ambulatory Visit: Admitting: Urology

## 2024-12-06 VITALS — BP 118/80 | HR 71

## 2024-12-06 DIAGNOSIS — N200 Calculus of kidney: Secondary | ICD-10-CM | POA: Diagnosis not present

## 2024-12-06 LAB — URINALYSIS, ROUTINE W REFLEX MICROSCOPIC
Bilirubin, UA: NEGATIVE
Glucose, UA: NEGATIVE
Ketones, UA: NEGATIVE
Nitrite, UA: NEGATIVE
Protein,UA: NEGATIVE
RBC, UA: NEGATIVE
Specific Gravity, UA: 1.03 (ref 1.005–1.030)
Urobilinogen, Ur: 1 mg/dL (ref 0.2–1.0)
pH, UA: 5.5 (ref 5.0–7.5)

## 2024-12-06 LAB — MICROSCOPIC EXAMINATION: Epithelial Cells (non renal): >10 /HPF — AB (ref 0–10)

## 2024-12-06 NOTE — Progress Notes (Signed)
 "  12/06/2024 8:59 AM   Sabrina [name] [birth year] [id number]  Referring provider: Donnise Norleen Lenis, MD [facility] [address] [place],  [place] [address]  Followup nephrolithiasis   HPI: Sabrina [name] is a 53yo here for followup for nephrolithiasis. Renal US  12/01/2024 shows no calculi. She drinks 16oz of water  daily. She drinks 8-10oz of tea daily. 1 cup of coffee daily. No stone events since last visit. She denies any flank pain.   PMH: Past Medical History:  Diagnosis Date   Anxiety    GERD (gastroesophageal reflux disease)    History of hiatal hernia    History of kidney stones    Hypothyroidism    Hypothyroidism    PONV (postoperative nausea and vomiting)     Surgical History: Past Surgical History:  Procedure Laterality Date   BIOPSY  03/05/2021   Procedure: BIOPSY;  Surgeon: Golda Claudis PENNER, MD;  [facility];  Service: Endoscopy;;  bx of gastric polyps   CESAREAN SECTION  2011   CHOLECYSTECTOMY N/A 09/13/2020   Procedure: LAPAROSCOPIC CHOLECYSTECTOMY;  Surgeon: Mavis Anes, MD;  [facility]: [facility];  Service: General;  Laterality: N/A;   ESOPHAGOGASTRODUODENOSCOPY (EGD) WITH PROPOFOL  N/A 03/05/2021   Procedure: ESOPHAGOGASTRODUODENOSCOPY (EGD) WITH PROPOFOL ;  Surgeon: Golda Claudis PENNER, MD;  [facility];  Service: Endoscopy;  Laterality: N/A;  AM   ESOPHAGOGASTRODUODENOSCOPY (EGD) WITH PROPOFOL  N/A 06/28/2023   Procedure: ESOPHAGOGASTRODUODENOSCOPY (EGD) WITH PROPOFOL ;  Surgeon: Eartha Angelia Sieving, MD;  [facility];  Service: Gastroenterology;  Laterality: N/A;  8:15AM;ASA 2   EXTRACORPOREAL SHOCK WAVE LITHOTRIPSY Left 04/15/2021   Procedure: EXTRACORPOREAL SHOCK WAVE LITHOTRIPSY (ESWL);  Surgeon: Sherrilee Belvie CROME, MD;  Location: [facility];  Service: Urology;  Laterality: Left;   HERNIA REPAIR  2015   LASIK  2019   LITHOTRIPSY  2017   NASAL SINUS SURGERY     x2   TUBAL LIGATION      Home  Medications:  Allergies as of 12/06/2024       Reactions   Erythromycin Nausea And Vomiting   Macrobid [nitrofurantoin] Palpitations        Medication List        Accurate as of December 06, 2024  8:59 AM. If you have any questions, ask your nurse or doctor.          busPIRone 10 MG tablet Commonly known as: BUSPAR Take 10 mg by mouth 2 (two) times daily.   cetirizine 10 MG tablet Commonly known as: ZYRTEC Take 10 mg by mouth every evening.   desvenlafaxine 50 MG 24 hr tablet Commonly known as: PRISTIQ Take 50 mg by mouth every evening.   docusate sodium  100 MG capsule Commonly known as: COLACE Take 200 mg by mouth at bedtime.   eletriptan 20 MG tablet Commonly known as: RELPAX Take 20 mg by mouth as needed for migraine or headache. May repeat in 2 hours if headache persists or recurs.   ibuprofen 200 MG tablet Commonly known as: ADVIL Take 800 mg by mouth every 8 (eight) hours as needed (pain.).   ipratropium 0.03 % nasal spray Commonly known as: ATROVENT Place 2 sprays into both nostrils at bedtime.   levothyroxine  100 MCG tablet Commonly known as: SYNTHROID  Take 1 tablet (100 mcg total) by mouth daily before breakfast.   mometasone 50 MCG/ACT nasal spray Commonly known as: NASONEX Place 1-2 sprays into the nose in the morning.   omeprazole  40 MG capsule Commonly  known as: PRILOSEC TAKE 1 CAPSULE (40 MG TOTAL) BY MOUTH 2 (TWO) TIMES DAILY.   Selenium 200 MCG Caps Take 200 mcg by mouth every evening.   valACYclovir 500 MG tablet Commonly known as: VALTREX Take 500 mg by mouth daily as needed (outbreak).        Allergies: Allergies[1]  Family History: Family History  Problem Relation Age of Onset   Cancer Mother    Breast cancer Mother    Heart disease Father    Diabetes Maternal Grandfather    Thyroid  disease Paternal Grandmother    Colon cancer Paternal Grandmother    Renal cancer Paternal Grandmother    Glaucoma Brother    Anxiety  disorder Brother    Hypercholesterolemia Brother    Healthy Son    Irritable bowel syndrome Son     Social History:  reports that she has never smoked. She has never been exposed to tobacco smoke. She has never used smokeless tobacco. She reports that she does not drink alcohol and does not use drugs.  ROS: All other review of systems were reviewed and are negative except what is noted above in HPI  Physical Exam: BP 118/80   Pulse 71   Constitutional:  Alert and oriented, No acute distress. HEENT: [place] AT, moist mucus membranes.  Trachea midline, no masses. Cardiovascular: No clubbing, cyanosis, or edema. Respiratory: Normal respiratory effort, no increased work of breathing. GI: Abdomen is soft, nontender, nondistended, no abdominal masses GU: No CVA tenderness.  Lymph: No cervical or inguinal lymphadenopathy. Skin: No rashes, bruises or suspicious lesions. Neurologic: Grossly intact, no focal deficits, moving all 4 extremities. Psychiatric: Normal mood and affect.  Laboratory Data: Lab Results  Component Value Date   WBC 8.9 08/19/2010   HGB 9.5 (L) 08/19/2010   HCT 28.2 (L) 08/19/2010   MCV 87.5 08/19/2010   PLT 165 08/19/2010    Lab Results  Component Value Date   CREATININE 0.61 08/13/2010    No results found for: PSA  No results found for: TESTOSTERONE  No results found for: HGBA1C  Urinalysis    Component Value Date/Time   COLORURINE YELLOW 08/13/2010 1617   APPEARANCEUR Clear 07/24/2022 1140   LABSPEC 1.015 08/13/2010 1617   PHURINE 7.5 08/13/2010 1617   GLUCOSEU Negative 07/24/2022 1140   HGBUR NEGATIVE 08/13/2010 1617   BILIRUBINUR Negative 07/24/2022 1140   KETONESUR NEGATIVE 08/13/2010 1617   PROTEINUR Negative 07/24/2022 1140   PROTEINUR NEGATIVE 08/13/2010 1617   UROBILINOGEN 0.2 08/13/2010 1617   NITRITE Negative 07/24/2022 1140   NITRITE NEGATIVE 08/13/2010 1617   LEUKOCYTESUR Negative 07/24/2022 1140    Lab Results  Component  Value Date   LABMICR See below: 07/24/2022   WBCUA 0-5 07/24/2022   LABEPIT None seen 07/24/2022   BACTERIA None seen 07/24/2022    Pertinent Imaging: Renal US  11/28/24: Images reviewed and discussed with the patient  Results for orders placed in visit on 04/25/21  DG Abd 1 View  Narrative CLINICAL DATA:  Intermittent flank pain, history kidney stones, recent lithotripsy  EXAM: ABDOMEN - 1 VIEW  COMPARISON:  By [id number]  FINDINGS: Tubal ligation clips noted, one in RIGHT pelvis, second in RIGHT mid abdomen.  Surgical clips RIGHT upper quadrant consistent with cholecystectomy.  Nonobstructive bowel gas pattern with scattered stool throughout colon.  No definite urinary tract calcifications identified.  IMPRESSION: No acute abnormalities.   Electronically Signed By: Oneil Kiss M.D. On: 04/27/2021 11:45  No results found for this or any previous  visit.  No results found for this or any previous visit.  No results found for this or any previous visit.  Results for orders placed during the hospital encounter of 11/28/24  US  RENAL  Narrative CLINICAL DATA:  Follow-up nephrolithiasis.  Prior lithotripsy.  EXAM: RENAL / URINARY TRACT ULTRASOUND COMPLETE  COMPARISON:  06/19/2022  FINDINGS: Right Kidney:  Renal measurements: 11.1 x 5.3 x 6.4 cm = volume: 197 mL. Echogenicity within normal limits. No mass or hydronephrosis visualized.  Left Kidney:  Renal measurements: 11.4 x 4.8 x 4.9 cm = volume: 142 mL. Echogenicity within normal limits. No mass or hydronephrosis visualized.  Bladder:  Appears normal for degree of bladder distention.  Other:  None.  IMPRESSION: 1. Normal renal ultrasound. 2. No sonographically evident renal calculi.   Electronically Signed By: Aliene Lloyd M.D. On: 12/01/2024 17:34  No results found for this or any previous visit.  No results found for this or any previous visit.  No results found for this or any  previous visit.   Assessment & Plan:    1. Kidney stones (Primary) Dietary handout given Followup prn - Urinalysis, Routine w reflex microscopic   No follow-ups on file.  Belvie Clara, MD  [facility] [place]       [1]  Allergies Allergen Reactions   Erythromycin Nausea And Vomiting   Macrobid [Nitrofurantoin] Palpitations   "

## 2024-12-07 ENCOUNTER — Encounter: Payer: Self-pay | Admitting: Urology

## 2024-12-08 ENCOUNTER — Ambulatory Visit
Admission: RE | Admit: 2024-12-08 | Discharge: 2024-12-08 | Disposition: A | Source: Ambulatory Visit | Attending: Obstetrics and Gynecology | Admitting: Obstetrics and Gynecology

## 2024-12-08 ENCOUNTER — Ambulatory Visit

## 2024-12-08 DIAGNOSIS — R928 Other abnormal and inconclusive findings on diagnostic imaging of breast: Secondary | ICD-10-CM

## 2024-12-12 ENCOUNTER — Encounter: Payer: Self-pay | Admitting: Urology

## 2024-12-12 NOTE — Patient Instructions (Signed)

## 2024-12-25 ENCOUNTER — Ambulatory Visit (INDEPENDENT_AMBULATORY_CARE_PROVIDER_SITE_OTHER): Admitting: Gastroenterology

## 2024-12-28 ENCOUNTER — Ambulatory Visit

## 2025-01-05 ENCOUNTER — Encounter: Payer: Self-pay | Admitting: Internal Medicine

## 2025-01-05 ENCOUNTER — Other Ambulatory Visit

## 2025-01-05 ENCOUNTER — Ambulatory Visit: Payer: BC Managed Care – PPO | Admitting: Internal Medicine

## 2025-01-05 VITALS — BP 120/80 | HR 73 | Ht 64.0 in | Wt 184.6 lb

## 2025-01-05 DIAGNOSIS — Z8639 Personal history of other endocrine, nutritional and metabolic disease: Secondary | ICD-10-CM

## 2025-01-05 DIAGNOSIS — E063 Autoimmune thyroiditis: Secondary | ICD-10-CM

## 2025-01-05 LAB — T4, FREE: Free T4: 1.2 ng/dL (ref 0.8–1.8)

## 2025-01-05 LAB — TSH: TSH: 0.66 m[IU]/L

## 2025-01-05 NOTE — Patient Instructions (Signed)
  Please continue Levothyroxine 100 mcg daily.  Take the thyroid hormone every day, with water, at least 30 minutes before breakfast, separated by at least 4 hours from: - acid reflux medications - calcium - iron - multivitamins  Please stop at the lab.  Please come back for a follow-up appointment in 1 year. 

## 2025-01-05 NOTE — Progress Notes (Signed)
 Patient ID: Sabrina [name], female   DOB: 11-22-1971, 54 y.o.   MRN: [id number]   HPI  Sabrina [name] is a 54 y.o.-year-old female, initially referred by her PCP, Dr. Donnise (PCP in [place]), returning for follow-up for Hashimoto's hypothyroidism.  She also has a history of thyroid  nodule.  She previously saw several endocrinologists.  Last visit with me 1 year ago. She lives in [place].  She sees Dr. Mat.    Interim history: She feels well, with occasional mental fog (improved), joint pains (persistent), and fatigue (persistent). She had weight gain - 12 lbs since last OV Before last visit she had had uterine ablation for long, heavy cycles. She now has some menses, not consistently. She was recently started on HRT (estrogen, progesterone, testosterone).  Also started Fiber supplements she feels much better on these - she did have more neck pressure when she was off the supplement so she restarted it.  Hypothyroidism: - dx'ed in 2014, during investigation for fatigue. At that time, she had thyroid  imaging and was found to have thyroid  nodules.  She is on levothyroxine  100 mcg: - fasting - with water  - separated by >30 min from b'fast  - no calcium, iron - stopped multivitamins later in the day - + prev. on Protonix 40 mg daily at bedtime >> changed to Dexilant  30 min after LT4  (!) >> moved to bedtime >> then stopped - on Prevacid at night >> now Omeprazole  at night. - on Super B complex (400 mcg biotin) - stopped   I reviewed pt's thyroid  tests: Lab Results  Component Value Date   TSH 1.03 01/06/2024   TSH 0.57 12/31/2022   TSH 0.45 12/25/2021   TSH 1.030 05/15/2021   TSH 0.87 10/10/2020   FREET4 1.5 01/06/2024   FREET4 0.99 12/31/2022   FREET4 1.12 12/25/2021   FREET4 1.33 05/15/2021   FREET4 1.19 10/10/2020   T3FREE 2.6 05/15/2021  07/04/2020: TSH 1.27 03/21/2020: TSH 1.06, free T4 1.4 02/23/2020: TSH 1.34 (0.45-4.5), free T4 1.37, total T3 101 04/10/2014: TSH  0.86 (0.34-5.66), free T4 1.12 (0.52-1.21)  Antithyroid antibodies: Component     Latest Ref Rng & Units 12/25/2021  Thyroglobulin Ab     < or = 1 IU/mL 1  Thyroperoxidase Ab SerPl-aCnc     <9 IU/mL 123 (H)   Component     Latest Ref Rng & Units 10/10/2020 05/15/2021  Thyroglobulin Ab     < or = 1 IU/mL 2 (H)   Thyroperoxidase Ab SerPl-aCnc     0 - 34 IU/mL 86 (H) 128 (H)  Thyroglobulin Antibody     0.0 - 0.9 IU/mL  1.4 (H)  03/21/2020: TPO antibodies 169 02/23/2020: TPO antibodies 190 (0-34) No components found for: TPOAB   We started selenium 200 mcg daily in 09/2020.  She started to feel much better after starting this.  She previously described: - weight gain - fatigue - cold intolerance - depression - constipation - dry skin - hair loss The symptoms improved.  Thyroid  nodule:  Reviewed previous investigation: Thyroid  ultrasound (02/2013): 1.7 x 1.03 cm right mid thyroid  nodule Thyroid  ultrasound in office-Dr. Esclamado (08/2013): No obvious thyroid  nodule Thyroid  ultrasound (04/10/2014): Heterogeneous, thyroid  lobes, without thyroid  nodules.  Small lymph nodes, benign-appearing.  Thyroid  ultrasound (02/20/2020): Heterogeneous thyroid  with increased vascularity throughout the entire gland, no nodules  Thyroid  ultrasound (12/01/2024): Moderate diffuse heterogeneity of the thyroid  parenchyma without discrete nodule.  Pt denies: - feeling nodules in neck - dysphagia -  choking She continues to describe increased fullness in her neck and also hoarseness after having had COVID-19 in 01/2020.  Interestingly, the fullness in her neck improved after gallbladder surgery 08/2020.  Also, it improved after starting fiber supplements.  She has + FH of thyroid  disorders in: PGM. 3 great aunts with thyroid  ds. No FH of thyroid  cancer.  No h/o radiation tx to head or neck. No recent use of iodine supplements.  She has a h/o 2 miscarriages. She had + ANA.  She saw rheumatology in the  past. She has a history of kidney stones-status post ESWL.  ROS: + See HPI  Past Medical History:  Diagnosis Date   Anxiety    GERD (gastroesophageal reflux disease)    History of hiatal hernia    History of kidney stones    Hypothyroidism    Hypothyroidism    PONV (postoperative nausea and vomiting)    Past Surgical History:  Procedure Laterality Date   BIOPSY  03/05/2021   Procedure: BIOPSY;  Surgeon: Golda Claudis PENNER, MD;  Location: [facility];  Service: Endoscopy;;  bx of gastric polyps   CESAREAN SECTION  2011   CHOLECYSTECTOMY N/A 09/13/2020   Procedure: LAPAROSCOPIC CHOLECYSTECTOMY;  Surgeon: Mavis Anes, MD;  Location: [facility];  Service: General;  Laterality: N/A;   ESOPHAGOGASTRODUODENOSCOPY (EGD) WITH PROPOFOL  N/A 03/05/2021   Procedure: ESOPHAGOGASTRODUODENOSCOPY (EGD) WITH PROPOFOL ;  Surgeon: Golda Claudis PENNER, MD;  Location: [facility];  Service: Endoscopy;  Laterality: N/A;  AM   ESOPHAGOGASTRODUODENOSCOPY (EGD) WITH PROPOFOL  N/A 06/28/2023   Procedure: ESOPHAGOGASTRODUODENOSCOPY (EGD) WITH PROPOFOL ;  Surgeon: Eartha Angelia Sieving, MD;  Location: [facility];  Service: Gastroenterology;  Laterality: N/A;  8:15AM;ASA 2   EXTRACORPOREAL SHOCK WAVE LITHOTRIPSY Left 04/15/2021   Procedure: EXTRACORPOREAL SHOCK WAVE LITHOTRIPSY (ESWL);  Surgeon: Sherrilee Belvie CROME, MD;  Location: [facility];  Service: Urology;  Laterality: Left;   HERNIA REPAIR  2015   LASIK  2019   LITHOTRIPSY  2017   NASAL SINUS SURGERY     x2   TUBAL LIGATION     Social History   Socioeconomic History   Marital status: Married    Spouse name: Not on file   Number of children: 2   Years of education: Not on file   Highest education level: Not on file  Occupational History   Occupation: Print production planner  Tobacco Use   Smoking status: Never    Passive exposure: Never   Smokeless tobacco: Never  Vaping Use   Vaping status: Never Used  Substance and Sexual Activity   Alcohol use: Never    Drug use: Never   Sexual activity: Yes  Other Topics Concern   Not on file  Social History Narrative   ** Merged History Encounter **       She has 1 adopted child, 53 years old in 09/2020   Social Drivers of Health   Tobacco Use: Low Risk (12/12/2024)   Patient History    Smoking Tobacco Use: Never    Smokeless Tobacco Use: Never    Passive Exposure: Never  Financial Resource Strain: Not on file  Food Insecurity: Not on file  Transportation Needs: Not on file  Physical Activity: Not on file  Stress: Not on file  Social Connections: Not on file  Intimate Partner Violence: Not on file  Depression (EYV7-0): Not on file  Alcohol Screen: Not on file  Housing: Not on file  Utilities: Not on file  Health Literacy: Not on  file   Current Outpatient Medications on File Prior to Visit  Medication Sig Dispense Refill   busPIRone (BUSPAR) 10 MG tablet Take 10 mg by mouth 2 (two) times daily.     cetirizine (ZYRTEC) 10 MG tablet Take 10 mg by mouth every evening.     desvenlafaxine (PRISTIQ) 50 MG 24 hr tablet Take 50 mg by mouth every evening.     docusate sodium  (COLACE) 100 MG capsule Take 200 mg by mouth at bedtime.     eletriptan (RELPAX) 20 MG tablet Take 20 mg by mouth as needed for migraine or headache. May repeat in 2 hours if headache persists or recurs.     ibuprofen (ADVIL) 200 MG tablet Take 800 mg by mouth every 8 (eight) hours as needed (pain.).     ipratropium (ATROVENT) 0.03 % nasal spray Place 2 sprays into both nostrils at bedtime.     levothyroxine  (SYNTHROID ) 100 MCG tablet Take 1 tablet (100 mcg total) by mouth daily before breakfast. 90 tablet 3   mometasone (NASONEX) 50 MCG/ACT nasal spray Place 1-2 sprays into the nose in the morning.     omeprazole  (PRILOSEC) 40 MG capsule TAKE 1 CAPSULE (40 MG TOTAL) BY MOUTH 2 (TWO) TIMES DAILY. 180 capsule 1   Selenium 200 MCG CAPS Take 200 mcg by mouth every evening.     valACYclovir (VALTREX) 500 MG tablet Take 500 mg by  mouth daily as needed (outbreak).     No current facility-administered medications on file prior to visit.   Allergies  Allergen Reactions   Erythromycin Nausea And Vomiting   Macrobid [Nitrofurantoin] Palpitations   Family History  Problem Relation Age of Onset   Cancer Mother    Breast cancer Mother    Heart disease Father    Diabetes Maternal Grandfather    Thyroid  disease Paternal Grandmother    Colon cancer Paternal Grandmother    Renal cancer Paternal Grandmother    Glaucoma Brother    Anxiety disorder Brother    Hypercholesterolemia Brother    Healthy Son    Irritable bowel syndrome Son    + See HPI  PE: BP 120/80   Pulse 73   Ht 5' 4 (1.626 m)   Wt 184 lb 9.6 oz (83.7 kg)   SpO2 98%   BMI 31.69 kg/m  Wt Readings from Last 10 Encounters:  01/05/25 184 lb 9.6 oz (83.7 kg)  03/30/24 171 lb (77.6 kg)  01/06/24 172 lb 9.6 oz (78.3 kg)  09/27/23 173 lb 6.4 oz (78.7 kg)  02/18/23 168 lb 1.6 oz (76.2 kg)  12/31/22 170 lb (77.1 kg)  12/10/22 168 lb (76.2 kg)  12/25/21 166 lb 12.8 oz (75.7 kg)  04/10/21 164 lb 9.6 oz (74.7 kg)  03/03/21 165 lb (74.8 kg)   Constitutional: Normal weight, in NAD Eyes:  EOMI, no exophthalmos ENT: + slight symmetric thyromegaly, no cervical lymphadenopathy Cardiovascular: RRR, No MRG Respiratory: CTA B Musculoskeletal: no deformities Skin: no rashes Neurological: no tremor with outstretched hands  ASSESSMENT: 1.  Hashimoto's hypothyroidism  2.  History of thyroid  nodules  PLAN:  1. Patient with long history of controlled autoimmune hypothyroidism, on levothyroxine  and selenium 200 mcg daily  - latest thyroid  labs reviewed with pt. >> normal: Lab Results  Component Value Date   TSH 1.03 01/06/2024  - she continues on LT4 100 mcg daily - pt feels good on this dose.  She was experiencing fatigue, joint pains, and mental fog >> started HRT and she  feels better, but did gain 12 pounds since last visit.  We discussed that this  may be related to testosterone supplementation. - we discussed about taking the thyroid  hormone every day, with water , >30 minutes before breakfast, separated by >4 hours from acid reflux medications, calcium, iron, multivitamins. Pt. is taking it correctly.  She was previously on Dexilant  taken only 30 minutes after levothyroxine  so I advised her to move this later in the day.  She is now on omeprazole  taken at night. - we discussed that HRT can influence the TFTs, but mostly with p.o. estrogen.  She is using an estrogen patch. - will check thyroid  tests today: TSH and fT4 - If labs are abnormal, she will need to return for repeat TFTs in 1.5 months - I will see her back in a year   2.  History of thyroid  nodules - She has a history of thyroid  enlargement after her COVID-19 infection, but this improved in 2021.  She had a right mid thyroid  dominant nodule on her initial ultrasound from 02/2013 but afterwards she had 3 ultrasounds including 1 in 2021 showing only signs of inflammation as expected in the setting of Hashimoto's thyroiditis, but no clear nodules.  We did discuss at last visit that she did not need further ultrasounds.  - She continues to have some mild intermittent neck pressure and we discussed that this is not unexpected in the setting of Hashimoto's thyroiditis inflammation.  She continues on selenium, which can also help with reducing her antithyroid antibodies.  We discussed about the gut health and the importance of maintaining this as a means to control autoimmunity.  She does mention that her neck pressure improved initially after her gallbladder surgery and then after starting fiber supplements.  She was off the fiber supplements for a period of time and she again developed neck pressure recently.  This improved with restarting her fiber supplements. - Due to the symptoms, since last visit, her OB/GYN provider obtained another ultrasound (12/01/2024) which only showed thyroid   inflammation.  Patient got in touch with me to review the results.  We again reviewed this today. I again advised her that she does not need further thyroid  ultrasounds  Needs refills.  Orders Placed This Encounter  Procedures   TSH   T4, free   Lela Fendt, MD PhD [facility]

## 2025-01-25 ENCOUNTER — Ambulatory Visit (INDEPENDENT_AMBULATORY_CARE_PROVIDER_SITE_OTHER): Admitting: Gastroenterology

## 2026-01-10 ENCOUNTER — Ambulatory Visit: Admitting: Internal Medicine
